# Patient Record
Sex: Male | Born: 1960
Health system: Southern US, Community
[De-identification: ages and names within clinical notes are randomized; demographics above are authoritative.]

## PROBLEM LIST (undated history)

## (undated) DIAGNOSIS — D126 Benign neoplasm of colon, unspecified: Secondary | ICD-10-CM

## (undated) DIAGNOSIS — C20 Malignant neoplasm of rectum: Secondary | ICD-10-CM

## (undated) DIAGNOSIS — K5792 Diverticulitis of intestine, part unspecified, without perforation or abscess without bleeding: Secondary | ICD-10-CM

## (undated) DIAGNOSIS — K7689 Other specified diseases of liver: Secondary | ICD-10-CM

## (undated) HISTORY — DX: Benign neoplasm of colon, unspecified: D12.6

## (undated) HISTORY — DX: Diverticulitis of intestine, part unspecified, without perforation or abscess without bleeding: K57.92

## (undated) HISTORY — DX: Other specified diseases of liver: K76.89

## (undated) HISTORY — PX: LAPAROSCOPIC LOW ANTERIOR RESECTION: SHX5904

---

## 2016-11-27 DIAGNOSIS — J069 Acute upper respiratory infection, unspecified: Secondary | ICD-10-CM | POA: Diagnosis not present

## 2017-02-21 DIAGNOSIS — J42 Unspecified chronic bronchitis: Secondary | ICD-10-CM | POA: Diagnosis not present

## 2017-08-04 ENCOUNTER — Encounter: Payer: Self-pay | Admitting: Internal Medicine

## 2017-08-26 ENCOUNTER — Ambulatory Visit (AMBULATORY_SURGERY_CENTER): Payer: Self-pay

## 2017-08-26 VITALS — Ht 74.0 in | Wt 225.2 lb

## 2017-08-26 DIAGNOSIS — Z1211 Encounter for screening for malignant neoplasm of colon: Secondary | ICD-10-CM

## 2017-08-26 MED ORDER — NA SULFATE-K SULFATE-MG SULF 17.5-3.13-1.6 GM/177ML PO SOLN: 1.0000 | Freq: Once | ORAL | 0 refills | Status: AC

## 2017-08-26 NOTE — Progress Notes (Signed)
Denies allergies to eggs or soy products. Denies complication of anesthesia or sedation. Denies use of weight loss medication. Denies use of O2.   Emmi instructions declined.  

## 2017-10-03 ENCOUNTER — Ambulatory Visit (AMBULATORY_SURGERY_CENTER): Payer: Federal, State, Local not specified - PPO | Admitting: Internal Medicine

## 2017-10-03 ENCOUNTER — Encounter: Payer: Self-pay | Admitting: Internal Medicine

## 2017-10-03 ENCOUNTER — Telehealth: Payer: Self-pay

## 2017-10-03 ENCOUNTER — Other Ambulatory Visit (INDEPENDENT_AMBULATORY_CARE_PROVIDER_SITE_OTHER): Payer: Federal, State, Local not specified - PPO

## 2017-10-03 ENCOUNTER — Other Ambulatory Visit: Payer: Self-pay

## 2017-10-03 VITALS — BP 113/72 | HR 60 | Temp 98.0°F | Resp 12 | Ht 74.0 in | Wt 225.0 lb

## 2017-10-03 DIAGNOSIS — C20 Malignant neoplasm of rectum: Secondary | ICD-10-CM

## 2017-10-03 DIAGNOSIS — C19 Malignant neoplasm of rectosigmoid junction: Secondary | ICD-10-CM | POA: Diagnosis not present

## 2017-10-03 DIAGNOSIS — K629 Disease of anus and rectum, unspecified: Secondary | ICD-10-CM | POA: Diagnosis not present

## 2017-10-03 DIAGNOSIS — Z1211 Encounter for screening for malignant neoplasm of colon: Secondary | ICD-10-CM

## 2017-10-03 DIAGNOSIS — D490 Neoplasm of unspecified behavior of digestive system: Secondary | ICD-10-CM

## 2017-10-03 DIAGNOSIS — D125 Benign neoplasm of sigmoid colon: Secondary | ICD-10-CM

## 2017-10-03 DIAGNOSIS — C187 Malignant neoplasm of sigmoid colon: Secondary | ICD-10-CM | POA: Diagnosis not present

## 2017-10-03 DIAGNOSIS — K6289 Other specified diseases of anus and rectum: Secondary | ICD-10-CM

## 2017-10-03 DIAGNOSIS — C260 Malignant neoplasm of intestinal tract, part unspecified: Secondary | ICD-10-CM

## 2017-10-03 HISTORY — PX: COLONOSCOPY: SHX174

## 2017-10-03 LAB — CBC WITH DIFFERENTIAL/PLATELET
Basophils Absolute: 0 10*3/uL (ref 0.0–0.1)
Basophils Relative: 0.6 % (ref 0.0–3.0)
EOS ABS: 0.1 10*3/uL (ref 0.0–0.7)
EOS PCT: 1.5 % (ref 0.0–5.0)
HCT: 44.6 % (ref 39.0–52.0)
HEMOGLOBIN: 14.7 g/dL (ref 13.0–17.0)
Lymphocytes Relative: 27.3 % (ref 12.0–46.0)
Lymphs Abs: 1.6 10*3/uL (ref 0.7–4.0)
MCHC: 33 g/dL (ref 30.0–36.0)
MCV: 86.6 fl (ref 78.0–100.0)
Monocytes Absolute: 0.5 10*3/uL (ref 0.1–1.0)
Monocytes Relative: 8.5 % (ref 3.0–12.0)
Neutro Abs: 3.6 10*3/uL (ref 1.4–7.7)
Neutrophils Relative %: 62.1 % (ref 43.0–77.0)
Platelets: 283 10*3/uL (ref 150.0–400.0)
RBC: 5.15 Mil/uL (ref 4.22–5.81)
RDW: 12.8 % (ref 11.5–15.5)
WBC: 5.7 10*3/uL (ref 4.0–10.5)

## 2017-10-03 LAB — COMPREHENSIVE METABOLIC PANEL
ALBUMIN: 4.3 g/dL (ref 3.5–5.2)
ALK PHOS: 58 U/L (ref 39–117)
ALT: 17 U/L (ref 0–53)
AST: 17 U/L (ref 0–37)
BUN: 10 mg/dL (ref 6–23)
CO2: 31 mEq/L (ref 19–32)
Calcium: 8.9 mg/dL (ref 8.4–10.5)
Chloride: 102 mEq/L (ref 96–112)
Creatinine, Ser: 1.11 mg/dL (ref 0.40–1.50)
GFR: 72.62 mL/min (ref >60.00)
Glucose, Bld: 94 mg/dL (ref 70–99)
POTASSIUM: 4.6 meq/L (ref 3.5–5.1)
SODIUM: 140 meq/L (ref 135–145)
TOTAL PROTEIN: 6.8 g/dL (ref 6.0–8.3)
Total Bilirubin: 0.7 mg/dL (ref 0.2–1.2)

## 2017-10-03 MED ORDER — SODIUM CHLORIDE 0.9 % IV SOLN: 500.0000 mL | INTRAVENOUS | Status: DC

## 2017-10-03 NOTE — Op Note (Signed)
[facility] Patient Name: Jeffrey Nolan Procedure Date: 10/03/2017 8:03 AM MRN: [id number] Endoscopist: Jerene Bears , MD Age: 56 Referring MD:  Date of Birth: 04-25-1961 Gender: Male Account #: [id number] Procedure:                Colonoscopy Indications:              Screening for colorectal malignant neoplasm, This                            is the patient's first colonoscopy Medicines:                Monitored Anesthesia Care Procedure:                Pre-Anesthesia Assessment:                           - Prior to the procedure, a History and Physical                            was performed, and patient medications and                            allergies were reviewed. The patient's tolerance of                            previous anesthesia was also reviewed. The risks                            and benefits of the procedure and the sedation                            options and risks were discussed with the patient.                            All questions were answered, and informed consent                            was obtained. Prior Anticoagulants: The patient has                            taken no previous anticoagulant or antiplatelet                            agents. ASA Grade Assessment: I - A normal, healthy                            patient. After reviewing the risks and benefits,                            the patient was deemed in satisfactory condition to                            undergo the procedure.  After obtaining informed consent, the colonoscope                            was passed under direct vision. Throughout the                            procedure, the patient's blood pressure, pulse, and                            oxygen saturations were monitored continuously. The                            Colonoscope was introduced through the anus and                            advanced to the the cecum, identified by                             appendiceal orifice and ileocecal valve. The                            colonoscopy was performed without difficulty. The                            patient tolerated the procedure well. The quality                            of the bowel preparation was good. The ileocecal                            valve, appendiceal orifice, and rectum were                            photographed. Scope In: 8:11:23 AM Scope Out: 8:35:41 AM Scope Withdrawal Time: 0 hours 19 minutes 0 seconds  Total Procedure Duration: 0 hours 24 minutes 18 seconds  Findings:                 The digital rectal exam was normal.                           A fungating non-obstructing medium-sized mass with                            central umbilication was found in the mid rectum.                            The mass was non-circumferential. In addition, its                            diameter measured 3-4 cm. This was biopsied with a                            cold forceps for histology. Area proximal to the  mass was tattooed with 2 injections, total of 3 mL                            of Spot (carbon black).                           A 7 mm polyp was found in the proximal sigmoid                            colon. The polyp was pedunculated. The polyp was                            removed with a hot snare. Resection and retrieval                            were complete.                           A 20 mm polyp was found in the distal sigmoid                            colon. The polyp was pedunculated. The polyp was                            removed with a hot snare. Resection and retrieval                            were complete.                           Multiple medium-mouthed diverticula were found in                            the sigmoid colon and ascending colon.                           Internal hemorrhoids were found during                             retroflexion. The hemorrhoids were small. Complications:            No immediate complications. Estimated Blood Loss:     Estimated blood loss was minimal. Impression:               - Likely malignant tumor in the mid rectum.                            Biopsied. Tattooed.                           - One 7 mm polyp in the proximal sigmoid colon,                            removed with a hot snare. Resected and retrieved.                           -  One 20 mm polyp in the distal sigmoid colon,                            removed with a hot snare. Resected and retrieved.                           - Mild diverticulosis in the sigmoid colon and in                            the ascending colon.                           - Small internal hemorrhoids. Recommendation:           - Patient has a contact number available for                            emergencies. The signs and symptoms of potential                            delayed complications were discussed with the                            patient. Return to normal activities tomorrow.                            Written discharge instructions were provided to the                            patient.                           - Resume previous diet.                           - Continue present medications.                           - Await pathology results.                           - Repeat colonoscopy for surveillance based on                            pathology results.                           - Perform a CT scan (computed tomography) of chest                            with contrast, abdomen with contrast and pelvis                            with contrast at the next available appointment.                           -  Anticipate surgical and oncologic referrals, the                            latter based on pathology results. Jerene Bears, MD 10/03/2017 8:45:42 AM This report has been signed electronically.

## 2017-10-03 NOTE — Progress Notes (Signed)
Called to room to assist during endoscopic procedure.  Patient ID and intended procedure confirmed with present staff. Received instructions for my participation in the procedure from the performing physician.  

## 2017-10-03 NOTE — Progress Notes (Signed)
A and O x3. Report to RN. Tolerated MAC anesthesia well.

## 2017-10-03 NOTE — Telephone Encounter (Signed)
Pt scheduled for CT of CAP at [facility] CT 10/08/17@2pm , pt to arrive there at 1:45pm. Pt to have no solid food after 10am, drink bottle 1 of contrast at 12noon and bottle 2 @ 1pm. [facility] RN to notify pt of appt and instructions.

## 2017-10-03 NOTE — Patient Instructions (Signed)
YOU HAD AN ENDOSCOPIC PROCEDURE TODAY AT [facility]:   Refer to the procedure report that was given to you for any specific questions about what was found during the examination.  If the procedure report does not answer your questions, please call your gastroenterologist to clarify.  If you requested that your care partner not be given the details of your procedure findings, then the procedure report has been included in a sealed envelope for you to review at your convenience later.  YOU SHOULD EXPECT: Some feelings of bloating in the abdomen. Passage of more gas than usual.  Walking can help get rid of the air that was put into your GI tract during the procedure and reduce the bloating. If you had a lower endoscopy (such as a colonoscopy or flexible sigmoidoscopy) you may notice spotting of blood in your stool or on the toilet paper. If you underwent a bowel prep for your procedure, you may not have a normal bowel movement for a few days.  Please Note:  You might notice some irritation and congestion in your nose or some drainage.  This is from the oxygen used during your procedure.  There is no need for concern and it should clear up in a day or so.  SYMPTOMS TO REPORT IMMEDIATELY:   Following lower endoscopy (colonoscopy or flexible sigmoidoscopy):  Excessive amounts of blood in the stool  Significant tenderness or worsening of abdominal pains  Swelling of the abdomen that is new, acute  Fever of 100F or higher   For urgent or emergent issues, a gastroenterologist can be reached at any hour by calling [phone].   DIET:  We do recommend a small meal at first, but then you may proceed to your regular diet.  Drink plenty of fluids but you should avoid alcoholic beverages for 24 hours.  ACTIVITY:  You should plan to take it easy for the rest of today and you should NOT DRIVE or use heavy machinery until tomorrow (because of the sedation medicines used during the test).     FOLLOW UP: Our staff will call the number listed on your records the next business day following your procedure to check on you and address any questions or concerns that you may have regarding the information given to you following your procedure. If we do not reach you, we will leave a message.  However, if you are feeling well and you are not experiencing any problems, there is no need to return our call.  We will assume that you have returned to your regular daily activities without incident.  If any biopsies were taken you will be contacted by phone or by letter within the next 1-3 weeks.  Please call us at [phone] if you have not heard about the biopsies in 3 weeks.    SIGNATURES/CONFIDENTIALITY: You and/or your care partner have signed paperwork which will be entered into your electronic medical record.  These signatures attest to the fact that that the information above on your After Visit Summary has been reviewed and is understood.  Full responsibility of the confidentiality of this discharge information lies with you and/or your care-partner.  Polyp, diverticulosis, and hemorrhoid information given.  Instructions for CT scan given with contrast.   Labs (CMP and CBC) prior to discharge.

## 2017-10-03 NOTE — Progress Notes (Signed)
Pt. Reports no change in his medical or surgical history since his pre-visit 08/26/2017.

## 2017-10-06 ENCOUNTER — Telehealth: Payer: Self-pay | Admitting: *Deleted

## 2017-10-06 ENCOUNTER — Telehealth: Payer: Self-pay | Admitting: Internal Medicine

## 2017-10-06 NOTE — Telephone Encounter (Signed)
Received call from Dr. Saralyn Pilar from pathology:  2 polyps showed invasive adenocarcinoma  Second part sigmoid showed invasive adenocarcinoma involving tubular adenoma  Part 3 invasive adenocarcinoma.  If questions call Dr. Saralyn Pilar [phone].

## 2017-10-06 NOTE — Telephone Encounter (Signed)
Pts CT scan moved up to 10/08/17@11 :15am, needs to arrive at [place] [place] at 11am. NPO after 7:15am, drink bottle one of contrast at 9:15am, bottle 2 at 10:15am. Flex-sig scheduled in the [facility] 10/08/17@2 :30pm, pt to check in at 1:30pm. Pt to drink bottle of mag citrate the night of  10/07/17 and use 2 fleets enemas prior to the Flex-sig. Pt may need to bring them with him to the [facility] to do them. [facility] referral and cancer center referral made. Patient aware.

## 2017-10-06 NOTE — Telephone Encounter (Signed)
I spoke with Dr. Saralyn Pilar by phone regarding Mr. [facility] pathology results He has a rectal adenocarcinoma 1 of the 2 sigmoid polyps, specifically the more distal sigmoid polyp was an adenocarcinoma.  This was a pedunculated lesion and the stalk margin was free of dysplasia and malignancy with a 2 mm margin. The more proximal sigmoid polyp was an adenoma without high-grade dysplasia  Patient has CT scan chest abdomen pelvis on Wednesday. I recommend that we have him return for a flex sig so that I can place a tattoo just proximal to the distal sigmoid polyp removed last Friday now known to be an adenocarcinoma.  I expect this lesion was cured by polypectomy but prefer tattooing proximal to this for surveillance purposes and possibly to include this segment in his future resection for his known rectal cancer.  Please refer to general surgery and medical oncology as soon as possible Patient very anxious to have surgical and oncology appointments. Time provided for questions and answers, he thanked me for the call  Recall colonoscopy 1 year Flex sig on Wednesday.

## 2017-10-06 NOTE — Telephone Encounter (Signed)
Pt calling for biopsy results, states Dr. Hilarie Fredrickson was going to call him today with results. Please advise. Looks like results not back yet in epic.

## 2017-10-06 NOTE — Telephone Encounter (Signed)
  Follow up Call-  Call back number 10/03/2017  Post procedure Call Back phone  # [phone]  Permission to leave phone message Yes     Patient questions:  Do you have a fever, pain , or abdominal swelling? No. Pain Score  0 *  Have you tolerated food without any problems? Yes.    Have you been able to return to your normal activities? Yes.    Do you have any questions about your discharge instructions: Diet   No. Medications  No. Follow up visit  No.  Do you have questions or concerns about your Care? No.  Actions: * If pain score is 4 or above: No action needed, pain <4.

## 2017-10-07 ENCOUNTER — Other Ambulatory Visit: Payer: Self-pay

## 2017-10-07 DIAGNOSIS — C189 Malignant neoplasm of colon, unspecified: Secondary | ICD-10-CM

## 2017-10-08 ENCOUNTER — Ambulatory Visit (AMBULATORY_SURGERY_CENTER): Payer: Federal, State, Local not specified - PPO | Admitting: Internal Medicine

## 2017-10-08 ENCOUNTER — Other Ambulatory Visit: Payer: Self-pay

## 2017-10-08 ENCOUNTER — Other Ambulatory Visit: Payer: Federal, State, Local not specified - PPO

## 2017-10-08 ENCOUNTER — Inpatient Hospital Stay: Admission: RE | Admit: 2017-10-08 | Payer: Federal, State, Local not specified - PPO | Source: Ambulatory Visit

## 2017-10-08 ENCOUNTER — Ambulatory Visit (INDEPENDENT_AMBULATORY_CARE_PROVIDER_SITE_OTHER)
Admission: RE | Admit: 2017-10-08 | Discharge: 2017-10-08 | Disposition: A | Discharge status: Home / Self Care | Payer: Federal, State, Local not specified - PPO | Source: Ambulatory Visit | Attending: Internal Medicine | Admitting: Internal Medicine

## 2017-10-08 ENCOUNTER — Encounter: Payer: Self-pay | Admitting: Internal Medicine

## 2017-10-08 VITALS — BP 120/76 | HR 63 | Temp 98.0°F | Resp 16 | Ht 74.0 in | Wt 225.0 lb

## 2017-10-08 DIAGNOSIS — D490 Neoplasm of unspecified behavior of digestive system: Secondary | ICD-10-CM

## 2017-10-08 DIAGNOSIS — C20 Malignant neoplasm of rectum: Secondary | ICD-10-CM

## 2017-10-08 DIAGNOSIS — C187 Malignant neoplasm of sigmoid colon: Secondary | ICD-10-CM | POA: Diagnosis present

## 2017-10-08 DIAGNOSIS — K769 Liver disease, unspecified: Secondary | ICD-10-CM

## 2017-10-08 DIAGNOSIS — C218 Malignant neoplasm of overlapping sites of rectum, anus and anal canal: Secondary | ICD-10-CM | POA: Diagnosis not present

## 2017-10-08 MED ORDER — SODIUM CHLORIDE 0.9 % IV SOLN: 500.0000 mL | Freq: Once | INTRAVENOUS | Status: DC

## 2017-10-08 MED ORDER — IOPAMIDOL (ISOVUE-300) INJECTION 61%
100.0000 mL | Freq: Once | INTRAVENOUS | Status: AC | PRN
  Administered 2017-10-08: 100 mL via INTRAVENOUS

## 2017-10-08 NOTE — Progress Notes (Signed)
  [facility] Post-op Note  Patient: Keshawn Fiorito  Procedure(s) Performed: flexible sigmoidoscopy  Patient Location: [facility] - Recovery Area  Anesthesia Type: Deep Sedation/Propofol  Level of Consciousness: awake, oriented and patient cooperative  Airway and Oxygen Therapy: Patient Spontanous Breathing  Post-op Pain: none  Post-op Assessment:  Post-op Vital signs reviewed, Patient's Cardiovascular Status Stable, Respiratory Function Stable, Patent Airway, No signs of Nausea or vomiting and Pain level controlled  Post-op Vital Signs: Reviewed and stable  Complications: No apparent anesthesia complications  Cannan Beeck E Kataryna Mcquilkin 3:00 PM

## 2017-10-08 NOTE — Progress Notes (Signed)
Called to room to assist during endoscopic procedure.  Patient ID and intended procedure confirmed with present staff. Received instructions for my participation in the procedure from the performing physician.  

## 2017-10-08 NOTE — Patient Instructions (Signed)
YOU HAD AN ENDOSCOPIC PROCEDURE TODAY AT [facility]:   Refer to the procedure report that was given to you for any specific questions about what was found during the examination.  If the procedure report does not answer your questions, please call your gastroenterologist to clarify.  If you requested that your care partner not be given the details of your procedure findings, then the procedure report has been included in a sealed envelope for you to review at your convenience later.  YOU SHOULD EXPECT: Some feelings of bloating in the abdomen. Passage of more gas than usual.  Walking can help get rid of the air that was put into your GI tract during the procedure and reduce the bloating. If you had a lower endoscopy (such as a colonoscopy or flexible sigmoidoscopy) you may notice spotting of blood in your stool or on the toilet paper. If you underwent a bowel prep for your procedure, you may not have a normal bowel movement for a few days.  Please Note:  You might notice some irritation and congestion in your nose or some drainage.  This is from the oxygen used during your procedure.  There is no need for concern and it should clear up in a day or so.  SYMPTOMS TO REPORT IMMEDIATELY:   Following lower endoscopy (colonoscopy or flexible sigmoidoscopy):  Excessive amounts of blood in the stool  Significant tenderness or worsening of abdominal pains  Swelling of the abdomen that is new, acute  Fever of 100F or higher  For urgent or emergent issues, a gastroenterologist can be reached at any hour by calling [phone].   DIET:  We do recommend a small meal at first, but then you may proceed to your regular diet.  Drink plenty of fluids but you should avoid alcoholic beverages for 24 hours.  ACTIVITY:  You should plan to take it easy for the rest of today and you should NOT DRIVE or use heavy machinery until tomorrow (because of the sedation medicines used during the test).     FOLLOW UP: Our staff will call the number listed on your records the next business day following your procedure to check on you and address any questions or concerns that you may have regarding the information given to you following your procedure. If we do not reach you, we will leave a message.  However, if you are feeling well and you are not experiencing any problems, there is no need to return our call.  We will assume that you have returned to your regular daily activities without incident.  See Dr. Dema Severin  Friday at 3:45PM  If any biopsies were taken you will be contacted by phone or by letter within the next 1-3 weeks.  Please call us at [phone] if you have not heard about the biopsies in 3 weeks.    SIGNATURES/CONFIDENTIALITY: You and/or your care partner have signed paperwork which will be entered into your electronic medical record.  These signatures attest to the fact that that the information above on your After Visit Summary has been reviewed and is understood.  Full responsibility of the confidentiality of this discharge information lies with you and/or your care-partner.

## 2017-10-08 NOTE — Op Note (Addendum)
[facility] Patient Name: Jeffrey Nolan Procedure Date: 10/08/2017 2:30 PM MRN: [id number] Endoscopist: Jerene Bears , MD Age: 56 Referring MD:  Date of Birth: January 17, 1961 Gender: Male Account #: [id number] Procedure:                Flexible Sigmoidoscopy Indications:              Rectal cancer recently diagnosed, Cancer in the                            sigmoid colon recently diagnosed (plan to tattoo                            proximal to the sigmoid cancer resected last week) Medicines:                Monitored Anesthesia Care Procedure:                Pre-Anesthesia Assessment:                           - Prior to the procedure, a History and Physical                            was performed, and patient medications and                            allergies were reviewed. The patient's tolerance of                            previous anesthesia was also reviewed. The risks                            and benefits of the procedure and the sedation                            options and risks were discussed with the patient.                            All questions were answered, and informed consent                            was obtained. Prior Anticoagulants: The patient has                            taken no previous anticoagulant or antiplatelet                            agents. ASA Grade Assessment: II - A patient with                            mild systemic disease. After reviewing the risks                            and benefits, the patient was deemed in  satisfactory condition to undergo the procedure.                           After obtaining informed consent, the scope was                            passed under direct vision. The Model PCF-H190DL                            [id number]) scope was introduced through the anus                            and advanced to the the descending colon. The                            flexible  sigmoidoscopy was accomplished without                            difficulty. The patient tolerated the procedure                            well. The quality of the bowel preparation was good. Scope In: Scope Out: Findings:                 The digital rectal exam revealed a rectal mass.                            This is palpable with only the tip of the examining                            finger.                           A single (solitary) six mm post-polypectomy ulcer                            was found in the mid sigmoid colon at approximately                            30 cm from the dentate line.                           A single (solitary) ten mm post-polypectomy ulcer                            was found in the distal sigmoid colon at                            approximately 22 cm from the dentate line. Area                            proximal to this ulceration was tattooed with 2  injections of total 3 mL of Spot (carbon black). No                            evidence for residual polyp.                           A non-obstructing mass was found in the mid rectum.                            The mass was non-circumferential. Complications:            No immediate complications. Estimated Blood Loss:     Estimated blood loss: none. Impression:               - Rectal mass.                           - A single (solitary) post-polypectomy ulcer in the                            mid sigmoid colon.                           - A single (solitary) post-polypectomy ulcer in the                            distal sigmoid colon. Tattooed.                           - Malignant tumor in the mid rectum.                           - No specimens collected. Recommendation:           - Patient has a contact number available for                            emergencies. The signs and symptoms of potential                            delayed complications were discussed  with the                            patient. Return to normal activities tomorrow.                            Written discharge instructions were provided to the                            patient.                           - Resume previous diet.                           - Follow-up results of todays CT scans.                           -  Surgical appointment to be scheduled and oncology                            thereafter. Jerene Bears, MD 10/08/2017 3:11:55 PM This report has been signed electronically. CC Letter to:             Sharon Mt. White MD, MD

## 2017-10-09 ENCOUNTER — Other Ambulatory Visit: Payer: Self-pay

## 2017-10-09 ENCOUNTER — Telehealth: Payer: Self-pay

## 2017-10-09 NOTE — Telephone Encounter (Signed)
  Follow up Call-  Call back number 10/08/2017 10/03/2017  Post procedure Call Back phone  # [phone]  Permission to leave phone message Yes Yes     Left message

## 2017-10-09 NOTE — Telephone Encounter (Signed)
  Follow up Call-  Call back number 10/08/2017 10/03/2017  Post procedure Call Back phone  # [phone]  Permission to leave phone message Yes Yes     Patient questions:  Do you have a fever, pain , or abdominal swelling? No. Pain Score  0 *  Have you tolerated food without any problems? Yes.    Have you been able to return to your normal activities? Yes.    Do you have any questions about your discharge instructions: Diet   No. Medications  No. Follow up visit  No.  Do you have questions or concerns about your Care? No.  Actions: * If pain score is 4 or above: No action needed, pain <4.

## 2017-10-10 ENCOUNTER — Telehealth: Payer: Self-pay

## 2017-10-10 ENCOUNTER — Telehealth: Payer: Self-pay | Admitting: Internal Medicine

## 2017-10-10 NOTE — Telephone Encounter (Signed)
Patient called to confirm attendance at office visit on 10/22/17. Returned call to let patient know that we will see him as scheduled. Patient voiced understanding.

## 2017-10-10 NOTE — Telephone Encounter (Signed)
Spoke with pt and let him know he needed to keep the appt as this was the cancer center referral appts.

## 2017-10-10 NOTE — Telephone Encounter (Signed)
Patient states Jeffrey Nolan called him yesterday and made him and appt at [facility] with Dr.Ashburn on 1.16.19 but pt states he just got a call for an appt with a surgeon, Dr. Benay Spice for 1.2.19 and wants to know which one to go to.

## 2017-10-13 NOTE — Progress Notes (Signed)
GI Location of Tumor / Histology: Rectal cancer  Jeffrey Nolan presented  months ago with symptoms :none,  Biopsies of  (if applicable) revealed: Diagnosis 10/03/17: 1. Colon, polyp(s), sigmoid, polyp (2) - TUBULAR ADENOMA (ONE POLYP). - STALK MARGIN NOT INVOLVED BY ADENOMA. - NO HIGH GRADE DYSPLASIA OR MALIGNANCY. 2. Colon, polyp(s), sigmoid, polyp - INVASIVE COLORECTAL ADENOCARCINOMA INVOLVING A TUBULAR ADENOMA. - STALK MARGIN NOT INVOLVED BY INVASIVE CARCINOMA OR ADENOMA. - SEE MICROSCOPIC DESCRIPTION. 3. Rectum, polyp(s), tumor, polyp - INVASIVE COLORECTAL ADENOCARCINOMA  Dr. Hilarie Fredrickson, MD 10/03/17 colonoscopy with BX, sees Dr. Drue Flirt sees Dec/16/18   Past/Anticipated interventions by medical oncology, if any:Dr. Benay Spice  appt 10/22/17@ 9am. MR/ abdomen scheduled 10/16/17  Weight changes, if any:  none  Bowel/Bladder complaints, if CNO:BSJG,   Nausea / Vomiting, if any: No Pain issues, if any: No  Any blood per rectum:   None SAFETY ISSUES: NO  Prior radiation? NO  Pacemaker/ICD? No  Is the patient on methotrexate? NO  Current Complaints/Details: Married,  Father deceased lung cancer non smoker, originally had bladder cancer,  Allergies:NKA .BP 117/89   Pulse 80   Temp 97.7 F (36.5 C) (Oral)   Resp 20   Ht 6\' 2"  (1.88 m)   Wt 227 lb 9.6 oz (103.2 kg)   BMI 29.22 kg/m   Wt Readings from Last 3 Encounters:  10/15/17 227 lb 9.6 oz (103.2 kg)  10/08/17 225 lb (102.1 kg)  10/03/17 225 lb (102.1 kg)

## 2017-10-15 ENCOUNTER — Ambulatory Visit
Admission: RE | Admit: 2017-10-15 | Discharge: 2017-10-15 | Disposition: A | Discharge status: Home / Self Care | Payer: Federal, State, Local not specified - PPO | Source: Ambulatory Visit | Attending: Radiation Oncology | Admitting: Radiation Oncology

## 2017-10-15 ENCOUNTER — Encounter: Payer: Self-pay | Admitting: Radiation Oncology

## 2017-10-15 ENCOUNTER — Other Ambulatory Visit: Payer: Self-pay | Admitting: Radiation Oncology

## 2017-10-15 DIAGNOSIS — Z791 Long term (current) use of non-steroidal anti-inflammatories (NSAID): Secondary | ICD-10-CM | POA: Insufficient documentation

## 2017-10-15 DIAGNOSIS — Z8551 Personal history of malignant neoplasm of bladder: Secondary | ICD-10-CM | POA: Diagnosis not present

## 2017-10-15 DIAGNOSIS — Z801 Family history of malignant neoplasm of trachea, bronchus and lung: Secondary | ICD-10-CM | POA: Insufficient documentation

## 2017-10-15 DIAGNOSIS — C20 Malignant neoplasm of rectum: Secondary | ICD-10-CM

## 2017-10-15 DIAGNOSIS — Z79899 Other long term (current) drug therapy: Secondary | ICD-10-CM | POA: Insufficient documentation

## 2017-10-15 DIAGNOSIS — Z510 Encounter for antineoplastic radiation therapy: Secondary | ICD-10-CM | POA: Insufficient documentation

## 2017-10-15 DIAGNOSIS — Z8052 Family history of malignant neoplasm of bladder: Secondary | ICD-10-CM | POA: Insufficient documentation

## 2017-10-15 DIAGNOSIS — Z87891 Personal history of nicotine dependence: Secondary | ICD-10-CM | POA: Insufficient documentation

## 2017-10-15 HISTORY — DX: Malignant neoplasm of rectum: C20

## 2017-10-15 NOTE — Progress Notes (Signed)
Radiation Oncology         ([phone]) [phone] ________________________________  Name: Jeffrey Nolan        MRN: [id number]  Date of Service: 10/15/2017 DOB: 09/23/1961  YQ:MVHQION, No Pcp Per  Ladell Pier, MD     REFERRING PHYSICIAN: Ladell Pier, MD   DIAGNOSIS: The encounter diagnosis was Rectal adenocarcinoma [facility]).   HISTORY OF PRESENT ILLNESS: Jeffrey Nolan is a 56 y.o. male seen at the request of Dr. Benay Spice. Patient went for a screening colonoscopy, with no changes or symptoms present. This was performed on 10/03/17 revealing a 3-4 cm mass in the rectum, as well as two polyps within the sigmoid colon. Final pathology of the rectal tumor was invasive adenocarcinoma, and the one polyp revealed tubular adenomatous change and invasive adenocarcinoma within a tubular adenoma. On 10/08/17 patient underwent tattooing with sigmoidoscopy, as well as a  CT of the chest, abdomen, and pelvis revealing 13 mm left renal lesion likely a complex cyst although there is a suggestion of internal architecture on the delayed images. No evidence for metastatic disease in the thorax. No lymphadenopathy in the abdomen or pelvis. No perirectal lymph nodes were evident in scan. Several tiny hypoattenuating lesions in the liver were too small to characterize and need further evaluation. He is to have an MRI of the abdomen at 12 pm  on 10/16/2017. Will see Dr. Benay Spice 10/22/17 at 9 am and he will also see Dr. Drue Flirt 11/05/16. He comes today to discuss options of radiotherapy in the neoadjuvant setting.   PREVIOUS RADIATION THERAPY: No   PAST MEDICAL HISTORY:  Past Medical History:  Diagnosis Date  . Cancer ([place])    Sigmoid (arising in polyp) and Rectum       PAST SURGICAL HISTORY:No past surgical history on file.   FAMILY HISTORY:  Family History  Problem Relation Age of Onset  . Bladder Cancer Father   . Lung cancer Father   . Colon cancer Neg Hx   . Esophageal cancer Neg Hx   . Pancreatic  cancer Neg Hx   . Rectal cancer Neg Hx   . Stomach cancer Neg Hx    Father died from lung cancer, nonsmoker. He originally had bladder cancer.    SOCIAL HISTORY:  reports that he has quit smoking. He has quit using smokeless tobacco. He reports that he drinks alcohol. He reports that he does not use drugs. He is married and accompanied by his wife. 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    ALLERGIES: Patient has no known allergies.   MEDICATIONS:  Current Outpatient Medications  Medication Sig Dispense Refill  . Ibuprofen-Diphenhydramine Cit (ADVIL PM PO) Take 1 capsule by mouth daily after supper.    . Melatonin 10 MG SUBL Place 10 mg under the tongue daily after supper.    Marland Kitchen OVER THE COUNTER MEDICATION Niacin 500 mg two tablets daily.    Marland Kitchen OVER THE COUNTER MEDICATION Garlic extract 6295 mg. One tablet daily.    Marland Kitchen OVER THE COUNTER MEDICATION Vitamin D 3 1000 units one tablet daily.    Marland Kitchen OVER THE COUNTER MEDICATION Fish Oil 1000 mg daily.    Marland Kitchen OVER THE COUNTER MEDICATION Vitamin C 1000 mg daily.    Marland Kitchen OVER THE COUNTER MEDICATION Biotin 1000 mg daily.    Marland Kitchen OVER THE COUNTER MEDICATION B complex vitamin. One tablet 4 times a week.    Marland Kitchen  sildenafil (REVATIO) 20 MG tablet TAKE 3 TABLETS BY MOUTH AS DIRECTED  3   Current Facility-Administered Medications  Medication Dose Route Frequency Provider Last Rate Last Dose  . 0.9 %  sodium chloride infusion  500 mL Intravenous Continuous Pyrtle, Lajuan Lines, MD      . 0.9 %  sodium chloride infusion  500 mL Intravenous Once Pyrtle, Lajuan Lines, MD      . 0.9 %  sodium chloride infusion  500 mL Intravenous Once Pyrtle, Lajuan Lines, MD         REVIEW OF SYSTEMS: On review of systems, the patient reports that he is doing well overall. he denies any chest pain, shortness of breath, cough, fevers, chills, night sweats, unintended weight changes. He denies any bowel or bladder  disturbances, and denies abdominal pain, nausea or vomiting, hematochezia, or melana. He denies any new musculoskeletal or joint aches or pains. A complete review of systems is obtained and is otherwise negative.     PHYSICAL EXAM:  Wt Readings from Last 3 Encounters:  10/15/17 227 lb 9.6 oz (103.2 kg)  10/08/17 225 lb (102.1 kg)  10/03/17 225 lb (102.1 kg)   Temp Readings from Last 3 Encounters:  10/15/17 97.7 F (36.5 C) (Oral)  10/08/17 98 F (36.7 C)  10/03/17 98 F (36.7 C)   BP Readings from Last 3 Encounters:  10/15/17 117/89  10/08/17 120/76  10/03/17 113/72   Pulse Readings from Last 3 Encounters:  10/15/17 80  10/08/17 63  10/03/17 60   Pain Assessment Pain Score: 0-No pain/10  In general this is a well appearing he is in no acute distress. he is alert and oriented x4 and appropriate throughout the examination. HEENT reveals that the patient is normocephalic, atraumatic. EOMs are intact. PERRLA. Skin is intact without any evidence of gross lesions. Cardiovascular exam reveals a regular rate and rhythm, no clicks rubs or murmurs are auscultated. Chest is clear to auscultation bilaterally. Lymphatic assessment is performed and does not reveal any adenopathy in the cervical, supraclavicular, axillary, or inguinal chains. Abdomen has active bowel sounds in all quadrants and is intact. The abdomen is soft, non tender, non distended. Lower extremities are negative for pretibial pitting edema, deep calf tenderness, cyanosis or clubbing.   ECOG = 0  0 - Asymptomatic (Fully active, able to carry on all predisease activities without restriction)  1 - Symptomatic but completely ambulatory (Restricted in physically strenuous activity but ambulatory and able to carry out work of a light or sedentary nature. For example, light housework, office work)  2 - Symptomatic, <50% in bed during the day (Ambulatory and capable of all self care but unable to carry out any work activities.  Up and about more than 50% of waking hours)  3 - Symptomatic, >50% in bed, but not bedbound (Capable of only limited self-care, confined to bed or chair 50% or more of waking hours)  4 - Bedbound (Completely disabled. Cannot carry on any self-care. Totally confined to bed or chair)  5 - Death   Eustace Pen MM, Creech RH, Tormey DC, et al. [phone]). "Toxicity and response criteria of the [facility]". [place] Oncol. 5 (6): 649-55    LABORATORY DATA:  Lab Results  Component Value Date   WBC 5.7 10/03/2017   HGB 14.7 10/03/2017   HCT 44.6 10/03/2017   MCV 86.6 10/03/2017   PLT 283.0 10/03/2017   Lab Results  Component Value Date   NA 140 10/03/2017  K 4.6 10/03/2017   CL 102 10/03/2017   CO2 31 10/03/2017   Lab Results  Component Value Date   ALT 17 10/03/2017   AST 17 10/03/2017   ALKPHOS 58 10/03/2017   BILITOT 0.7 10/03/2017      RADIOGRAPHY: Ct Chest W Contrast  Result Date: 10/08/2017 CLINICAL DATA:  Rectal cancer EXAM: CT CHEST, ABDOMEN, AND PELVIS WITH CONTRAST TECHNIQUE: Multidetector CT imaging of the chest, abdomen and pelvis was performed following the standard protocol during bolus administration of intravenous contrast. CONTRAST:  136mL ISOVUE-300 IOPAMIDOL (ISOVUE-300) INJECTION 61% COMPARISON:  None. FINDINGS: CT CHEST FINDINGS Cardiovascular: The heart size is normal. No pericardial effusion. No thoracic aortic aneurysm. Mediastinum/Nodes: No mediastinal lymphadenopathy. Calcified lymph nodes are seen in the subcarinal station and right hilum. The esophagus has normal imaging features. There is no hilar lymphadenopathy. Lungs/Pleura: Calcified granuloma identified in the medial right lung base. No suspicious pulmonary nodule or mass. No focal airspace consolidation. No pleural effusion. Musculoskeletal: Bone windows reveal no worrisome lytic or sclerotic osseous lesions. CT ABDOMEN PELVIS FINDINGS Hepatobiliary: Several tiny hypoattenuating  lesions are identified in the liver parenchyma (see images 56, 62, 63, and 71). These are too small to characterize and measure up to a maximum of about 8 mm diameter. There is no evidence for gallstones, gallbladder wall thickening, or pericholecystic fluid. No intrahepatic or extrahepatic biliary dilation. Pancreas: No focal mass lesion. No dilatation of the main duct. No intraparenchymal cyst. No peripancreatic edema. Spleen: No splenomegaly. No focal mass lesion. Adrenals/Urinary Tract: No adrenal nodule or mass. Right kidney is normal. 13 mm left renal lesion identified in the upper pole has attenuation higher than would be expected for a simple cyst (image 75 series 2) in shows stippled internal high attenuation on delayed imaging (see image 10 series 5). No evidence for hydroureter. The urinary bladder appears normal for the degree of distention. Stomach/Bowel: Stomach is nondistended. No gastric wall thickening. No evidence of outlet obstruction. Duodenum is normally positioned as is the ligament of Treitz. No small bowel wall thickening. No small bowel dilatation. The terminal ileum is normal. The appendix is normal. Mild diverticular change noted left colon. No discrete distal colonic lesion is identified in this patient with known rectal cancer. Vascular/Lymphatic: No abdominal aortic aneurysm. There is no gastrohepatic or hepatoduodenal ligament lymphadenopathy. No intraperitoneal or retroperitoneal lymphadenopathy. No pelvic sidewall lymphadenopathy. No evidence for perirectal lymph nodes. Reproductive: The prostate gland and seminal vesicles have normal imaging features. Other: No intraperitoneal free fluid. Musculoskeletal: Bone windows reveal no worrisome lytic or sclerotic osseous lesions. IMPRESSION: 1. Several tiny hypoattenuating lesions in the liver are too small to characterize. While these are likely benign, MRI of the abdomen without and with contrast recommended to further evaluate. 2. 13 mm  left renal lesion likely a complex cyst although there is a suggestion of internal architecture on the delayed images today. This could also be further assessed at the time of abdominal MRI. 3. No evidence for metastatic disease in the thorax. No lymphadenopathy in the abdomen or pelvis. No perirectal lymph nodes evident on today's scan. Electronically Signed   By: Misty Stanley M.D.   On: 10/08/2017 14:15   Ct Abdomen Pelvis W Contrast  Result Date: 10/08/2017 CLINICAL DATA:  Rectal cancer EXAM: CT CHEST, ABDOMEN, AND PELVIS WITH CONTRAST TECHNIQUE: Multidetector CT imaging of the chest, abdomen and pelvis was performed following the standard protocol during bolus administration of intravenous contrast. CONTRAST:  166mL ISOVUE-300 IOPAMIDOL (ISOVUE-300) INJECTION 61%  COMPARISON:  None. FINDINGS: CT CHEST FINDINGS Cardiovascular: The heart size is normal. No pericardial effusion. No thoracic aortic aneurysm. Mediastinum/Nodes: No mediastinal lymphadenopathy. Calcified lymph nodes are seen in the subcarinal station and right hilum. The esophagus has normal imaging features. There is no hilar lymphadenopathy. Lungs/Pleura: Calcified granuloma identified in the medial right lung base. No suspicious pulmonary nodule or mass. No focal airspace consolidation. No pleural effusion. Musculoskeletal: Bone windows reveal no worrisome lytic or sclerotic osseous lesions. CT ABDOMEN PELVIS FINDINGS Hepatobiliary: Several tiny hypoattenuating lesions are identified in the liver parenchyma (see images 56, 62, 63, and 71). These are too small to characterize and measure up to a maximum of about 8 mm diameter. There is no evidence for gallstones, gallbladder wall thickening, or pericholecystic fluid. No intrahepatic or extrahepatic biliary dilation. Pancreas: No focal mass lesion. No dilatation of the main duct. No intraparenchymal cyst. No peripancreatic edema. Spleen: No splenomegaly. No focal mass lesion. Adrenals/Urinary  Tract: No adrenal nodule or mass. Right kidney is normal. 13 mm left renal lesion identified in the upper pole has attenuation higher than would be expected for a simple cyst (image 75 series 2) in shows stippled internal high attenuation on delayed imaging (see image 10 series 5). No evidence for hydroureter. The urinary bladder appears normal for the degree of distention. Stomach/Bowel: Stomach is nondistended. No gastric wall thickening. No evidence of outlet obstruction. Duodenum is normally positioned as is the ligament of Treitz. No small bowel wall thickening. No small bowel dilatation. The terminal ileum is normal. The appendix is normal. Mild diverticular change noted left colon. No discrete distal colonic lesion is identified in this patient with known rectal cancer. Vascular/Lymphatic: No abdominal aortic aneurysm. There is no gastrohepatic or hepatoduodenal ligament lymphadenopathy. No intraperitoneal or retroperitoneal lymphadenopathy. No pelvic sidewall lymphadenopathy. No evidence for perirectal lymph nodes. Reproductive: The prostate gland and seminal vesicles have normal imaging features. Other: No intraperitoneal free fluid. Musculoskeletal: Bone windows reveal no worrisome lytic or sclerotic osseous lesions. IMPRESSION: 1. Several tiny hypoattenuating lesions in the liver are too small to characterize. While these are likely benign, MRI of the abdomen without and with contrast recommended to further evaluate. 2. 13 mm left renal lesion likely a complex cyst although there is a suggestion of internal architecture on the delayed images today. This could also be further assessed at the time of abdominal MRI. 3. No evidence for metastatic disease in the thorax. No lymphadenopathy in the abdomen or pelvis. No perirectal lymph nodes evident on today's scan. Electronically Signed   By: Misty Stanley M.D.   On: 10/08/2017 14:15       IMPRESSION/PLAN: 1. T2-3Nx invasive adenocarcinoma of the rectum.  Dr. Lisbeth Renshaw discusses the pathology findings and reviews the nature of rectal cancer. He discusses the role of an MRI of the pelvis to better stage his cancer. If he has a T2N0 lesion, surgery may be all that is recommended, however if he has T2N1 or greater disease, he would recommend neoadjuvant radiotherapy with chemosensitization to treat his disease prior to surgery. We discussed the risks, benefits, short, and long term effects of radiotherapy, and the patient is interested in proceeding. Dr. Lisbeth Renshaw discusses the delivery and logistics of radiotherapy and anticipates a course of 5 1/2-6 weeks. Written consent is obtained and placed in the chart, a copy was provided to the patient. He will simulate tomorrow provided that his MRI dictates a role to move forward. We would anticipate proceeding with treatment on 10/27/17.  The above documentation reflects my direct findings during this shared patient visit. Please see the separate note by Dr. Lisbeth Renshaw on this date for the remainder of the patient's plan of care.     Carola Rhine, PAC --------------------------------------------  Jodelle Gross, MD, PhD  This document serves as a record of services personally performed by Kyung Rudd MD. It was created on his behalf by Delton Coombes, a trained medical scribe. The creation of this record is based on the scribe's personal observations and the provider's statements to them.

## 2017-10-15 NOTE — Progress Notes (Signed)
Please see the Nurse Progress Note in the MD Initial Consult Encounter for this patient. 

## 2017-10-16 ENCOUNTER — Ambulatory Visit: Payer: Federal, State, Local not specified - PPO | Admitting: Radiation Oncology

## 2017-10-16 ENCOUNTER — Ambulatory Visit (HOSPITAL_COMMUNITY): Admission: RE | Admit: 2017-10-16 | Payer: Federal, State, Local not specified - PPO | Source: Ambulatory Visit

## 2017-10-16 ENCOUNTER — Ambulatory Visit (HOSPITAL_COMMUNITY)
Admission: RE | Admit: 2017-10-16 | Discharge: 2017-10-16 | Disposition: A | Discharge status: Home / Self Care | Payer: Federal, State, Local not specified - PPO | Source: Ambulatory Visit | Attending: Internal Medicine | Admitting: Internal Medicine

## 2017-10-16 ENCOUNTER — Ambulatory Visit (HOSPITAL_COMMUNITY)
Admission: RE | Admit: 2017-10-16 | Discharge: 2017-10-16 | Disposition: A | Discharge status: Home / Self Care | Payer: Federal, State, Local not specified - PPO | Source: Ambulatory Visit | Attending: Radiation Oncology | Admitting: Radiation Oncology

## 2017-10-16 DIAGNOSIS — J069 Acute upper respiratory infection, unspecified: Secondary | ICD-10-CM | POA: Diagnosis not present

## 2017-10-16 DIAGNOSIS — C20 Malignant neoplasm of rectum: Secondary | ICD-10-CM

## 2017-10-16 DIAGNOSIS — K769 Liver disease, unspecified: Secondary | ICD-10-CM

## 2017-10-16 MED ORDER — GADOBENATE DIMEGLUMINE 529 MG/ML IV SOLN: 20.0000 mL | Freq: Once | INTRAVENOUS | Status: DC | PRN

## 2017-10-16 NOTE — Progress Notes (Signed)
Pt unable to do MRI due to claustrophobia. Pt to call office in am and get pre meds. Order is still in epic. Order will need to be rescheduled.

## 2017-10-17 ENCOUNTER — Telehealth: Payer: Self-pay | Admitting: *Deleted

## 2017-10-17 ENCOUNTER — Ambulatory Visit
Admission: RE | Admit: 2017-10-17 | Discharge: 2017-10-17 | Disposition: A | Discharge status: Home / Self Care | Payer: Federal, State, Local not specified - PPO | Source: Ambulatory Visit | Attending: Radiation Oncology | Admitting: Radiation Oncology

## 2017-10-17 DIAGNOSIS — Z8052 Family history of malignant neoplasm of bladder: Secondary | ICD-10-CM | POA: Diagnosis not present

## 2017-10-17 DIAGNOSIS — Z87891 Personal history of nicotine dependence: Secondary | ICD-10-CM | POA: Diagnosis not present

## 2017-10-17 DIAGNOSIS — Z791 Long term (current) use of non-steroidal anti-inflammatories (NSAID): Secondary | ICD-10-CM | POA: Diagnosis not present

## 2017-10-17 DIAGNOSIS — Z801 Family history of malignant neoplasm of trachea, bronchus and lung: Secondary | ICD-10-CM | POA: Diagnosis not present

## 2017-10-17 DIAGNOSIS — Z79899 Other long term (current) drug therapy: Secondary | ICD-10-CM | POA: Diagnosis not present

## 2017-10-17 DIAGNOSIS — Z510 Encounter for antineoplastic radiation therapy: Secondary | ICD-10-CM | POA: Diagnosis not present

## 2017-10-17 DIAGNOSIS — C20 Malignant neoplasm of rectum: Secondary | ICD-10-CM | POA: Diagnosis not present

## 2017-10-17 MED ORDER — LORAZEPAM 1 MG PO TABS: 1.0000 mg | ORAL_TABLET | Freq: Three times a day (TID) | ORAL | 0 refills | Status: DC | PRN

## 2017-10-17 NOTE — Telephone Encounter (Signed)
Dr. Lisbeth Renshaw to e-script valium to patient pharmacy in [place], #2

## 2017-10-17 NOTE — Telephone Encounter (Signed)
error 

## 2017-10-20 ENCOUNTER — Ambulatory Visit (HOSPITAL_COMMUNITY)
Admission: RE | Admit: 2017-10-20 | Discharge: 2017-10-20 | Disposition: A | Discharge status: Home / Self Care | Payer: Federal, State, Local not specified - PPO | Source: Ambulatory Visit | Attending: Radiation Oncology | Admitting: Radiation Oncology

## 2017-10-20 DIAGNOSIS — C20 Malignant neoplasm of rectum: Secondary | ICD-10-CM | POA: Insufficient documentation

## 2017-10-20 DIAGNOSIS — N281 Cyst of kidney, acquired: Secondary | ICD-10-CM | POA: Diagnosis not present

## 2017-10-20 DIAGNOSIS — K7689 Other specified diseases of liver: Secondary | ICD-10-CM | POA: Insufficient documentation

## 2017-10-20 MED ORDER — GADOBENATE DIMEGLUMINE 529 MG/ML IV SOLN
20.0000 mL | Freq: Once | INTRAVENOUS | Status: AC | PRN
  Administered 2017-10-20: 20 mL via INTRAVENOUS

## 2017-10-22 ENCOUNTER — Encounter: Payer: Self-pay | Admitting: *Deleted

## 2017-10-22 ENCOUNTER — Other Ambulatory Visit: Payer: Federal, State, Local not specified - PPO

## 2017-10-22 ENCOUNTER — Other Ambulatory Visit: Payer: Self-pay

## 2017-10-22 ENCOUNTER — Encounter: Payer: Self-pay | Admitting: Oncology

## 2017-10-22 ENCOUNTER — Inpatient Hospital Stay: Payer: Federal, State, Local not specified - PPO | Attending: Oncology | Admitting: Oncology

## 2017-10-22 ENCOUNTER — Other Ambulatory Visit: Payer: Self-pay | Admitting: Pharmacist

## 2017-10-22 ENCOUNTER — Telehealth: Payer: Self-pay | Admitting: Pharmacist

## 2017-10-22 VITALS — BP 143/80 | HR 74 | Temp 98.1°F | Resp 17 | Ht 74.0 in | Wt 228.5 lb

## 2017-10-22 DIAGNOSIS — C20 Malignant neoplasm of rectum: Secondary | ICD-10-CM | POA: Diagnosis not present

## 2017-10-22 MED ORDER — CAPECITABINE 500 MG PO TABS: 2000.0000 mg | ORAL_TABLET | Freq: Two times a day (BID) | ORAL | 0 refills | Status: DC

## 2017-10-22 MED FILL — CAPECITABINE 500 MG TABLET: 500 | 30 days supply | Qty: 176 | Fill #0

## 2017-10-22 NOTE — Progress Notes (Signed)
  Oncology Nurse Navigator Documentation  Navigator Location: [facility] (10/22/17 1120) Referral date to RadOnc/MedOnc: 11/07/17 (10/22/17 1120) )Navigator Encounter Type: Initial MedOnc (10/22/17 1120)   Abnormal Finding Date: 11/03/17 (10/22/17 1120) Confirmed Diagnosis Date: 11/06/17 (10/22/17 1120)               Patient Visit Type: MedOnc;Initial (10/22/17 1120)   Barriers/Navigation Needs: Coordination of Care (10/22/17 1120)   Interventions: Coordination of Care (10/22/17 1120)  Met with patient during initial med/onc appointment. I introduced myself and my role as GI navigator and provided my contact information. I also told patient about the GI Support Group that meets the third Monday of each month. Patient verbalized understanding that they can call me with questions or concerns. I called Johny Drilling -PharmD to ask that she meet with patient after Chemo Ed class today to review oral chemo.          Acuity: Level 1 (10/22/17 1120) Acuity Level 1: Initial guidance, education and coordination as needed;Minimal follow up required (10/22/17 1120)       Time Spent with Patient: 15 (10/22/17 1120)

## 2017-10-22 NOTE — Telephone Encounter (Signed)
Oral Oncology Pharmacist Encounter  Spoke with patient and his wife after chemo education regarding new prescription for capecitabine for the treatment of rectal cancer in conjunction with radiation therapy, planned duration ~5 and 1/2 weeks. Therapy is in neoadjuvant setting.  Patient will take capecitabine 2000mg  PO BID within 30 minutes of a meal on days of radiation only (Mon-Fri) (825 mg/m2 PO BID). start date: 10/27/2017 with first day of radiation  Labs from 12/14 assessed, okay for treatment. CrCL ~109 mL/min.   Current medication list in Epic reviewed, no serious DDIs with capecitabine identified. He will only be taking his garlic and vitamin C during his treatment.   Side effects include but not limited to: nausea/vomiting/diarrhea, hand and foot syndrome, mucositis, fatigue, decreased blood counts.    Reviewed with patient importance of keeping a medication schedule and plan for any missed doses. We also discussed safe handling precautions.   His first fill will come from [facility] and will cost [phone] - we will call him to set up shipment today. His second and final fill will need to come from [facility]. He and his wife understood the specialty pharmacy process as she orders her medications from them too.   Patient knows to call the office with questions or concerns. Oral Oncology Clinic will continue to follow.  Demetrius Charity, PharmD PGY2 Oncology Pharmacy Resident  Pharmacy Phone: [phone] 10/22/2017

## 2017-10-22 NOTE — Progress Notes (Signed)
[place] Patient Consult   Referring MD: Arshdeep Bolger 57 y.o.  Dec 16, 1960    Reason for Referral: Rectal cancer   HPI: Mr. Selvidge underwent a screening colonoscopy on 10/03/2017.  A nonobstructing mass was noted in the mid rectum.  The mass was biopsied and the area was tattooed.  A 7 mm polyp was removed from the proximal sigmoid colon.  A 20 mm polyp was removed from the distal sigmoid colon.  Diverticula were noted in the sigmoid and ascending colon.  The pathology [phone]) revealed a tubular adenoma involving 1 of the sigmoid polyps.  The other sigmoid polyp had invasive colorectal adenocarcinoma involving a tubular adenoma.  The stalk margin was not involved by invasive carcinoma.  The rectum biopsy returned as invasive adenocarcinoma.  He was taken to a flexible sigmoidoscopy 10/08/2017 and a tattoo was placed proximal to the ulceration from the polypectomy at 22 cm from the dentate line.  No evidence of residual polyp.  CTs of the chest, abdomen, and pelvis 10/08/2017 revealed no suspicious pulmonary nodule or mass.  Tiny hypoattenuation lesions in the liver are too small to characterize.  No colonic lesion identified.  No pelvic adenopathy or perirectal lymph nodes.   A staging MRI of the abdomen/pelvis 10/20/2017-revealed benign left renal cysts.  Scattered hepatic cysts.  Polypoid rectal mass was noted without lymphadenopathy.  The tumor was measured at 9 cm from the and was noted to extend through the muscularis propria, T3.  He reports feeling well.  He had no symptoms prior to the colonoscopy.     Past Medical History:  Diagnosis Date  . Cancer ([place])    Sigmoid (arising in polyp) and Rectum    Past surgical history:-Wisdom tooth surgery as a youth  Medications: Reviewed  Allergies: No Known Allergies  Family history: His father had bladder cancer and was a non-smoker.  He died of "lung cancer ".  No other family history  of cancer.  Social History:   He lives in [place].  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  He uses smokeless tobacco, he reports social cigarette use in the past.  He has a history of heavy alcohol use, quit using alcohol heavily 2-3 years ago, currently has approximately 1-2 beers per month  ROS:   Positives include: Recent upper respiratory infection with a cough and congestion, low-grade fever  A complete ROS was otherwise negative.  Physical Exam:  Blood pressure (!) 143/80, pulse 74, temperature 98.1 F (36.7 C), temperature source Oral, resp. rate 17, height 6\' 2"  (1.88 m), weight 228 lb 8 oz (103.6 kg), SpO2 98 %.  HEENT: Oropharynx without visible mass, neck without mass Lungs: Clear bilaterally Cardiac: Regular rate and rhythm Abdomen: No hepatomegaly, nontender, no mass GU: Testes without mass Vascular: No leg edema Lymph nodes: No cervical, supraclavicular, axillary, or inguinal nodes Neurologic: Alert and oriented, the motor exam appears intact in the upper and lower extremities Skin: No rash Musculoskeletal: No spine tenderness   LAB:  CBC  Lab Results  Component Value Date   WBC 5.7 10/03/2017   HGB 14.7 10/03/2017   HCT 44.6 10/03/2017   MCV 86.6 10/03/2017   PLT 283.0 10/03/2017   NEUTROABS 3.6 10/03/2017        CMP     Component Value Date/Time   NA 140 10/03/2017 0928   K 4.6 10/03/2017 0928   CL 102 10/03/2017 0928  CO2 31 10/03/2017 0928   GLUCOSE 94 10/03/2017 0928   BUN 10 10/03/2017 0928   CREATININE 1.11 10/03/2017 0928   CALCIUM 8.9 10/03/2017 0928   PROT 6.8 10/03/2017 0928   ALBUMIN 4.3 10/03/2017 0928   AST 17 10/03/2017 0928   ALT 17 10/03/2017 0928   ALKPHOS 58 10/03/2017 0928   BILITOT 0.7 10/03/2017 0928      Imaging:  As per HPI   Assessment/Plan:   1. Rectal cancer-invasive adenocarcinoma confirmed on biopsy of a mid rectal mass 10/03/2017  Staging CTs from  10/08/2017, indeterminate liver lesions, no rectal mass identified.  No lymphadenopathy.  MRI abdomen/pelvis 10/20/2017-no suspicious liver lesions, T3 N0 M0 staging with tumor measured at 9 cm from the anal sphincter 2.    Distal sigmoid polyp on colonoscopy 10/03/2017-invasive adenocarcinoma involving a tubular adenoma, invasive carcinoma measured 1.1 cm with no lymphovascular space involvement and negative resection margin        Flexible sigmoidoscopy 10/08/2017-no residual polyp identified, area of previous polyp at 22 cm tattooed   Disposition:   Mr. Borba has been diagnosed with rectal cancer.  He has clinical stage II disease.  He saw Dr. Lisbeth Renshaw and is scheduled to begin neoadjuvant radiation 10/27/2017.  I recommend concurrent capecitabine.  I discussed the rationale for neoadjuvant therapy with Ms. Savage and his wife.  We will review the surgical pathology from the rectum resection and decide on the indication for adjuvant systemic therapy.  We reviewed the potential toxicities associated with capecitabine including the chance for nausea, diarrhea, mucositis, and hematologic toxicity.  We discussed the sun sensitivity, rash, hyperpigmentation, and hand/foot syndrome associated with capecitabine.  He agrees to proceed.  He will attended a chemotherapy teaching class.  We will obtain a baseline CEA when he returns for an office visit 11/07/2016.  He understands his family members are at increased risk of developing colorectal cancer and should receive appropriate screening.  The resected tumor will be tested for HNPCC.  50 minutes were spent with the patient today.  The majority of the time was used for counseling and coordination of care. Betsy Coder, MD  10/22/2017, 6:41 PM

## 2017-10-22 NOTE — Progress Notes (Signed)
Patient here for new patient visit. He has a cold, with productive cough.

## 2017-10-23 NOTE — Progress Notes (Signed)
  Radiation Oncology         [phone]) [phone] ________________________________  Name: Jeffrey Nolan MRN: [id number]  Date: 10/17/2017  DOB: 1960/11/07  Optical Surface Tracking Plan:  Since intensity modulated radiotherapy (IMRT) and 3D conformal radiation treatment methods are predicated on accurate and precise positioning for treatment, intrafraction motion monitoring is medically necessary to ensure accurate and safe treatment delivery.  The ability to quantify intrafraction motion without excessive ionizing radiation dose can only be performed with optical surface tracking. Accordingly, surface imaging offers the opportunity to obtain 3D measurements of patient position throughout IMRT and 3D treatments without excessive radiation exposure.  I am ordering optical surface tracking for this patient's upcoming course of radiotherapy. ________________________________  Kyung Rudd, MD 10/23/2017 6:58 PM    Reference:   Particia Jasper, et al. Surface imaging-based analysis of intrafraction motion for breast radiotherapy patients.Journal of [place], n. 6, nov. 2014. ISSN [id number].   Available at: <http://www.jacmp.org/index.php/jacmp/article/view/4957>.

## 2017-10-23 NOTE — Progress Notes (Signed)
  Radiation Oncology         [phone]) [phone] ________________________________  Name: Jeffrey Nolan MRN: [id number]  Date: 10/17/2017  DOB: 02/23/1961   SIMULATION AND TREATMENT PLANNING NOTE  DIAGNOSIS:     ICD-10-CM   1. Rectal adenocarcinoma ([place]) C20      The patient presented for simulation for the patient's upcoming course of radiation for the diagnosis of rectal cancer. The patient was placed in a supine position. A customized vac-lock bag was constructed to aid in patient immobilization on. This complex treatment device will be used on a daily basis during the treatment. In this fashion a CT scan was obtained through the pelvic region and the isocenter was placed near midline within the pelvis. Surface markings were placed.  The patient's imaging was loaded into the radiation treatment planning system. The patient will initially be planned to receive a course of radiation to a dose of 45 Gy. This will be accomplished in 25 fractions at 1.8 gray per fraction. This initial treatment will correspond to a 3-D conformal technique. The target has been contoured in addition to the rectum, bladder and femoral heads. Dose volume histograms of each of these structures have been requested and these will be carefully reviewed as part of the 3-D conformal treatment planning process. To accomplish this initial treatment, 4 customized blocks have been designed for this purpose. Each of these 4 complex treatment devices will be used on a daily basis during the initial course of the treatment. It is anticipated that the patient will then receive a boost for an additional 5.4 Gy. The anticipated total dose therefore will be 50.4 Gy.    Special treatment procedure The patient will receive chemotherapy during the course of radiation treatment. The patient may experience increased or overlapping toxicity due to this combined-modality approach and the patient will be monitored for such problems. This may  include extra lab work as necessary. This therefore constitutes a special treatment procedure.    ________________________________  Jodelle Gross, MD, PhD

## 2017-10-24 ENCOUNTER — Encounter: Payer: Self-pay | Admitting: *Deleted

## 2017-10-24 DIAGNOSIS — Z79899 Other long term (current) drug therapy: Secondary | ICD-10-CM | POA: Diagnosis not present

## 2017-10-24 DIAGNOSIS — Z8052 Family history of malignant neoplasm of bladder: Secondary | ICD-10-CM | POA: Diagnosis not present

## 2017-10-24 DIAGNOSIS — Z87891 Personal history of nicotine dependence: Secondary | ICD-10-CM | POA: Diagnosis not present

## 2017-10-24 DIAGNOSIS — Z791 Long term (current) use of non-steroidal anti-inflammatories (NSAID): Secondary | ICD-10-CM | POA: Diagnosis not present

## 2017-10-24 DIAGNOSIS — Z510 Encounter for antineoplastic radiation therapy: Secondary | ICD-10-CM | POA: Diagnosis not present

## 2017-10-24 DIAGNOSIS — Z801 Family history of malignant neoplasm of trachea, bronchus and lung: Secondary | ICD-10-CM | POA: Diagnosis not present

## 2017-10-24 DIAGNOSIS — C20 Malignant neoplasm of rectum: Secondary | ICD-10-CM | POA: Diagnosis not present

## 2017-10-27 ENCOUNTER — Ambulatory Visit
Admission: RE | Admit: 2017-10-27 | Discharge: 2017-10-27 | Disposition: A | Discharge status: Home / Self Care | Payer: Federal, State, Local not specified - PPO | Source: Ambulatory Visit | Attending: Radiation Oncology | Admitting: Radiation Oncology

## 2017-10-27 DIAGNOSIS — Z510 Encounter for antineoplastic radiation therapy: Secondary | ICD-10-CM | POA: Diagnosis not present

## 2017-10-27 DIAGNOSIS — Z87891 Personal history of nicotine dependence: Secondary | ICD-10-CM | POA: Diagnosis not present

## 2017-10-27 DIAGNOSIS — C20 Malignant neoplasm of rectum: Secondary | ICD-10-CM | POA: Diagnosis not present

## 2017-10-27 DIAGNOSIS — Z79899 Other long term (current) drug therapy: Secondary | ICD-10-CM | POA: Diagnosis not present

## 2017-10-27 DIAGNOSIS — Z791 Long term (current) use of non-steroidal anti-inflammatories (NSAID): Secondary | ICD-10-CM | POA: Diagnosis not present

## 2017-10-27 DIAGNOSIS — Z801 Family history of malignant neoplasm of trachea, bronchus and lung: Secondary | ICD-10-CM | POA: Diagnosis not present

## 2017-10-27 DIAGNOSIS — Z8052 Family history of malignant neoplasm of bladder: Secondary | ICD-10-CM | POA: Diagnosis not present

## 2017-10-28 ENCOUNTER — Telehealth: Payer: Self-pay

## 2017-10-28 ENCOUNTER — Ambulatory Visit
Admission: RE | Admit: 2017-10-28 | Discharge: 2017-10-28 | Disposition: A | Discharge status: Home / Self Care | Payer: Federal, State, Local not specified - PPO | Source: Ambulatory Visit | Attending: Radiation Oncology | Admitting: Radiation Oncology

## 2017-10-28 DIAGNOSIS — J324 Chronic pansinusitis: Secondary | ICD-10-CM | POA: Diagnosis not present

## 2017-10-28 DIAGNOSIS — Z79899 Other long term (current) drug therapy: Secondary | ICD-10-CM | POA: Diagnosis not present

## 2017-10-28 DIAGNOSIS — Z791 Long term (current) use of non-steroidal anti-inflammatories (NSAID): Secondary | ICD-10-CM | POA: Diagnosis not present

## 2017-10-28 DIAGNOSIS — Z87891 Personal history of nicotine dependence: Secondary | ICD-10-CM | POA: Diagnosis not present

## 2017-10-28 DIAGNOSIS — Z8052 Family history of malignant neoplasm of bladder: Secondary | ICD-10-CM | POA: Diagnosis not present

## 2017-10-28 DIAGNOSIS — Z510 Encounter for antineoplastic radiation therapy: Secondary | ICD-10-CM | POA: Diagnosis not present

## 2017-10-28 DIAGNOSIS — Z801 Family history of malignant neoplasm of trachea, bronchus and lung: Secondary | ICD-10-CM | POA: Diagnosis not present

## 2017-10-28 DIAGNOSIS — C20 Malignant neoplasm of rectum: Secondary | ICD-10-CM | POA: Diagnosis not present

## 2017-10-28 NOTE — Telephone Encounter (Signed)
Wife called to inform that patient has "had a cold for about two weeks now". Stated that now "theres blood when he blows his nose" and "tightness in his chest" described as "his ribs are sore from coughing". Patient wife also notes that he is coughing up green phlegm. Patient went to urgent care per his wife. Made Dr. Benay Spice aware. Wife knows to call with any additional concerns.

## 2017-10-29 ENCOUNTER — Telehealth: Payer: Self-pay | Admitting: Oncology

## 2017-10-29 ENCOUNTER — Ambulatory Visit
Admission: RE | Admit: 2017-10-29 | Discharge: 2017-10-29 | Disposition: A | Discharge status: Home / Self Care | Payer: Federal, State, Local not specified - PPO | Source: Ambulatory Visit | Attending: Radiation Oncology | Admitting: Radiation Oncology

## 2017-10-29 ENCOUNTER — Telehealth: Payer: Self-pay | Admitting: *Deleted

## 2017-10-29 DIAGNOSIS — Z79899 Other long term (current) drug therapy: Secondary | ICD-10-CM | POA: Diagnosis not present

## 2017-10-29 DIAGNOSIS — Z510 Encounter for antineoplastic radiation therapy: Secondary | ICD-10-CM | POA: Diagnosis not present

## 2017-10-29 DIAGNOSIS — C20 Malignant neoplasm of rectum: Secondary | ICD-10-CM | POA: Diagnosis not present

## 2017-10-29 DIAGNOSIS — Z791 Long term (current) use of non-steroidal anti-inflammatories (NSAID): Secondary | ICD-10-CM | POA: Diagnosis not present

## 2017-10-29 DIAGNOSIS — Z8052 Family history of malignant neoplasm of bladder: Secondary | ICD-10-CM | POA: Diagnosis not present

## 2017-10-29 DIAGNOSIS — Z801 Family history of malignant neoplasm of trachea, bronchus and lung: Secondary | ICD-10-CM | POA: Diagnosis not present

## 2017-10-29 DIAGNOSIS — Z87891 Personal history of nicotine dependence: Secondary | ICD-10-CM | POA: Diagnosis not present

## 2017-10-29 MED ORDER — PROCHLORPERAZINE MALEATE 10 MG PO TABS: 10.0000 mg | ORAL_TABLET | Freq: Four times a day (QID) | ORAL | 0 refills | Status: DC | PRN

## 2017-10-29 NOTE — Telephone Encounter (Signed)
Call discussed with Dr. Benay Spice: Stop doxycycline if not diagnosed with pneumonia. Order received for Compazine 10mg  Q6h PRN nausea. OK to take Tylenol for headache.  Returned call to wife with above instructions. She voiced understanding. She reports they were told "his lungs were clear." Instructed her to have pt push PO fluids, call office if he develops fever or dyspnea.

## 2017-10-29 NOTE — Telephone Encounter (Signed)
Call from pt's wife reporting nausea that is not relieved with Zofran. Pt was seen in urgent care for productive cough with green sputum and prescribed doxycycline. Nausea began after starting antibiotic.  Pt also reports mild headache that began before he started taking Zofran. Denies any vision or gait changes. Pt denied fever or dyspnea. Requesting different antiemetic.

## 2017-10-29 NOTE — Telephone Encounter (Signed)
Spoke to patient regarding upcoming January appointments.  °

## 2017-10-30 ENCOUNTER — Ambulatory Visit
Admission: RE | Admit: 2017-10-30 | Discharge: 2017-10-30 | Disposition: A | Discharge status: Home / Self Care | Payer: Federal, State, Local not specified - PPO | Source: Ambulatory Visit | Attending: Radiation Oncology | Admitting: Radiation Oncology

## 2017-10-30 DIAGNOSIS — Z79899 Other long term (current) drug therapy: Secondary | ICD-10-CM | POA: Diagnosis not present

## 2017-10-30 DIAGNOSIS — Z87891 Personal history of nicotine dependence: Secondary | ICD-10-CM | POA: Diagnosis not present

## 2017-10-30 DIAGNOSIS — Z510 Encounter for antineoplastic radiation therapy: Secondary | ICD-10-CM | POA: Diagnosis not present

## 2017-10-30 DIAGNOSIS — Z8052 Family history of malignant neoplasm of bladder: Secondary | ICD-10-CM | POA: Diagnosis not present

## 2017-10-30 DIAGNOSIS — Z801 Family history of malignant neoplasm of trachea, bronchus and lung: Secondary | ICD-10-CM | POA: Diagnosis not present

## 2017-10-30 DIAGNOSIS — C20 Malignant neoplasm of rectum: Secondary | ICD-10-CM | POA: Diagnosis not present

## 2017-10-30 DIAGNOSIS — Z791 Long term (current) use of non-steroidal anti-inflammatories (NSAID): Secondary | ICD-10-CM | POA: Diagnosis not present

## 2017-10-31 ENCOUNTER — Ambulatory Visit
Admission: RE | Admit: 2017-10-31 | Discharge: 2017-10-31 | Disposition: A | Discharge status: Home / Self Care | Payer: Federal, State, Local not specified - PPO | Source: Ambulatory Visit | Attending: Radiation Oncology | Admitting: Radiation Oncology

## 2017-10-31 DIAGNOSIS — Z79899 Other long term (current) drug therapy: Secondary | ICD-10-CM | POA: Diagnosis not present

## 2017-10-31 DIAGNOSIS — Z791 Long term (current) use of non-steroidal anti-inflammatories (NSAID): Secondary | ICD-10-CM | POA: Diagnosis not present

## 2017-10-31 DIAGNOSIS — C20 Malignant neoplasm of rectum: Secondary | ICD-10-CM | POA: Diagnosis not present

## 2017-10-31 DIAGNOSIS — Z801 Family history of malignant neoplasm of trachea, bronchus and lung: Secondary | ICD-10-CM | POA: Diagnosis not present

## 2017-10-31 DIAGNOSIS — Z87891 Personal history of nicotine dependence: Secondary | ICD-10-CM | POA: Diagnosis not present

## 2017-10-31 DIAGNOSIS — Z8052 Family history of malignant neoplasm of bladder: Secondary | ICD-10-CM | POA: Diagnosis not present

## 2017-10-31 DIAGNOSIS — Z510 Encounter for antineoplastic radiation therapy: Secondary | ICD-10-CM | POA: Diagnosis not present

## 2017-11-03 ENCOUNTER — Ambulatory Visit
Admission: RE | Admit: 2017-11-03 | Discharge: 2017-11-03 | Disposition: A | Discharge status: Home / Self Care | Payer: Federal, State, Local not specified - PPO | Source: Ambulatory Visit | Attending: Radiation Oncology | Admitting: Radiation Oncology

## 2017-11-03 DIAGNOSIS — Z79899 Other long term (current) drug therapy: Secondary | ICD-10-CM | POA: Diagnosis not present

## 2017-11-03 DIAGNOSIS — Z87891 Personal history of nicotine dependence: Secondary | ICD-10-CM | POA: Diagnosis not present

## 2017-11-03 DIAGNOSIS — Z801 Family history of malignant neoplasm of trachea, bronchus and lung: Secondary | ICD-10-CM | POA: Diagnosis not present

## 2017-11-03 DIAGNOSIS — Z8052 Family history of malignant neoplasm of bladder: Secondary | ICD-10-CM | POA: Diagnosis not present

## 2017-11-03 DIAGNOSIS — Z791 Long term (current) use of non-steroidal anti-inflammatories (NSAID): Secondary | ICD-10-CM | POA: Diagnosis not present

## 2017-11-03 DIAGNOSIS — Z510 Encounter for antineoplastic radiation therapy: Secondary | ICD-10-CM | POA: Diagnosis not present

## 2017-11-03 DIAGNOSIS — C20 Malignant neoplasm of rectum: Secondary | ICD-10-CM | POA: Diagnosis not present

## 2017-11-04 ENCOUNTER — Emergency Department (HOSPITAL_COMMUNITY): Payer: Federal, State, Local not specified - PPO

## 2017-11-04 ENCOUNTER — Ambulatory Visit
Admission: RE | Admit: 2017-11-04 | Discharge: 2017-11-04 | Disposition: A | Discharge status: Home / Self Care | Payer: Federal, State, Local not specified - PPO | Source: Ambulatory Visit | Attending: Radiation Oncology | Admitting: Radiation Oncology

## 2017-11-04 ENCOUNTER — Inpatient Hospital Stay (HOSPITAL_COMMUNITY)
Admission: EM | Admit: 2017-11-04 | Discharge: 2017-11-09 | DRG: 392 | Disposition: A | Discharge status: Home / Self Care | Payer: Federal, State, Local not specified - PPO | Attending: Internal Medicine | Admitting: Internal Medicine

## 2017-11-04 ENCOUNTER — Encounter: Payer: Self-pay | Admitting: Oncology

## 2017-11-04 ENCOUNTER — Other Ambulatory Visit: Payer: Self-pay

## 2017-11-04 DIAGNOSIS — R05 Cough: Secondary | ICD-10-CM | POA: Diagnosis not present

## 2017-11-04 DIAGNOSIS — C20 Malignant neoplasm of rectum: Secondary | ICD-10-CM | POA: Diagnosis present

## 2017-11-04 DIAGNOSIS — K5900 Constipation, unspecified: Secondary | ICD-10-CM | POA: Diagnosis present

## 2017-11-04 DIAGNOSIS — K5720 Diverticulitis of large intestine with perforation and abscess without bleeding: Secondary | ICD-10-CM | POA: Diagnosis not present

## 2017-11-04 DIAGNOSIS — Z87891 Personal history of nicotine dependence: Secondary | ICD-10-CM

## 2017-11-04 DIAGNOSIS — R509 Fever, unspecified: Secondary | ICD-10-CM | POA: Diagnosis not present

## 2017-11-04 DIAGNOSIS — Z801 Family history of malignant neoplasm of trachea, bronchus and lung: Secondary | ICD-10-CM | POA: Diagnosis not present

## 2017-11-04 DIAGNOSIS — Z791 Long term (current) use of non-steroidal anti-inflammatories (NSAID): Secondary | ICD-10-CM | POA: Diagnosis not present

## 2017-11-04 DIAGNOSIS — D72829 Elevated white blood cell count, unspecified: Secondary | ICD-10-CM | POA: Diagnosis not present

## 2017-11-04 DIAGNOSIS — Z79899 Other long term (current) drug therapy: Secondary | ICD-10-CM | POA: Diagnosis not present

## 2017-11-04 DIAGNOSIS — Z8052 Family history of malignant neoplasm of bladder: Secondary | ICD-10-CM | POA: Diagnosis not present

## 2017-11-04 DIAGNOSIS — E876 Hypokalemia: Secondary | ICD-10-CM | POA: Diagnosis not present

## 2017-11-04 DIAGNOSIS — R109 Unspecified abdominal pain: Secondary | ICD-10-CM | POA: Diagnosis not present

## 2017-11-04 DIAGNOSIS — Z510 Encounter for antineoplastic radiation therapy: Secondary | ICD-10-CM | POA: Diagnosis not present

## 2017-11-04 HISTORY — DX: Malignant neoplasm of rectum: C20

## 2017-11-04 LAB — URINALYSIS, ROUTINE W REFLEX MICROSCOPIC
Bacteria, UA: NONE SEEN
Bilirubin Urine: NEGATIVE
Glucose, UA: NEGATIVE mg/dL
Ketones, ur: NEGATIVE mg/dL
Leukocytes, UA: NEGATIVE
Nitrite: NEGATIVE
Protein, ur: NEGATIVE mg/dL
Specific Gravity, Urine: 1.013 (ref 1.005–1.030)
Squamous Epithelial / HPF: NONE SEEN
WBC, UA: NONE SEEN WBC/hpf (ref 0–5)
pH: 6 (ref 5.0–8.0)

## 2017-11-04 LAB — CBC WITH DIFFERENTIAL/PLATELET
BASOS ABS: 0 10*3/uL (ref 0.0–0.1)
Basophils Relative: 0 %
Eosinophils Absolute: 0 10*3/uL (ref 0.0–0.7)
Eosinophils Relative: 0 %
HEMATOCRIT: 39.2 % (ref 39.0–52.0)
Hemoglobin: 13 g/dL (ref 13.0–17.0)
LYMPHS ABS: 0.8 10*3/uL (ref 0.7–4.0)
LYMPHS PCT: 7 %
MCH: 28.6 pg (ref 26.0–34.0)
MCHC: 33.2 g/dL (ref 30.0–36.0)
MCV: 86.3 fL (ref 78.0–100.0)
MONO ABS: 1 10*3/uL (ref 0.1–1.0)
MONOS PCT: 9 %
NEUTROS ABS: 9.4 10*3/uL — AB (ref 1.7–7.7)
Neutrophils Relative %: 84 %
PLATELETS: 229 10*3/uL (ref 150–400)
RBC: 4.54 MIL/uL (ref 4.22–5.81)
RDW: 12.8 % (ref 11.5–15.5)
WBC: 11.3 10*3/uL — ABNORMAL HIGH (ref 4.0–10.5)

## 2017-11-04 LAB — COMPREHENSIVE METABOLIC PANEL WITH GFR
ALT: 24 U/L (ref 17–63)
AST: 21 U/L (ref 15–41)
Albumin: 3.7 g/dL (ref 3.5–5.0)
Alkaline Phosphatase: 65 U/L (ref 38–126)
Anion gap: 8 (ref 5–15)
BUN: 19 mg/dL (ref 6–20)
CO2: 26 mmol/L (ref 22–32)
Calcium: 8.7 mg/dL — ABNORMAL LOW (ref 8.9–10.3)
Chloride: 103 mmol/L (ref 101–111)
Creatinine, Ser: 1.01 mg/dL (ref 0.61–1.24)
GFR calc Af Amer: >60 mL/min
GFR calc non Af Amer: >60 mL/min
Glucose, Bld: 106 mg/dL — ABNORMAL HIGH (ref 65–99)
Potassium: 3.8 mmol/L (ref 3.5–5.1)
Sodium: 137 mmol/L (ref 135–145)
Total Bilirubin: 1 mg/dL (ref 0.3–1.2)
Total Protein: 6.8 g/dL (ref 6.5–8.1)

## 2017-11-04 LAB — INFLUENZA PANEL BY PCR (TYPE A & B)
Influenza A By PCR: NEGATIVE
Influenza B By PCR: NEGATIVE

## 2017-11-04 LAB — I-STAT CG4 LACTIC ACID, ED: Lactic Acid, Venous: 0.99 mmol/L (ref 0.5–1.9)

## 2017-11-04 LAB — LIPASE, BLOOD: Lipase: 22 U/L (ref 11–51)

## 2017-11-04 MED ORDER — IOPAMIDOL (ISOVUE-300) INJECTION 61%
INTRAVENOUS | Status: AC
  Filled 2017-11-04: qty 100

## 2017-11-04 MED ORDER — IOPAMIDOL (ISOVUE-300) INJECTION 61%
100.0000 mL | Freq: Once | INTRAVENOUS | Status: AC | PRN
  Administered 2017-11-04: 100 mL via INTRAVENOUS

## 2017-11-04 MED ORDER — SODIUM CHLORIDE 0.9 % IV BOLUS (SEPSIS)
1000.0000 mL | Freq: Once | INTRAVENOUS | Status: AC
  Administered 2017-11-04: 1000 mL via INTRAVENOUS

## 2017-11-04 NOTE — ED Triage Notes (Signed)
[facility] Pt reports 9/10 sharp bladder pain, pt denies dysuria. Pt reports fever at home of 100.6, pt arrives afebrile here. Pt chemo treatments are M thru F. Pt reports completeing chemo today w/o incident today. Pt denies chills. Pt reports expected nausea from chemo treatments. Pt A+OX4, speaking in complete sentences, ambulatory independently.

## 2017-11-04 NOTE — ED Provider Notes (Signed)
[facility] DEPT Provider Note   CSN: [id number] Arrival date & time: 11/04/17  2054     History   Chief Complaint Chief Complaint  Patient presents with  . CHEMO CARD  . Bladder Pain    HPI Jeffrey Nolan is a 57 y.o. male.  HPI  57 year old male with a history of rectal adenocarcinoma diagnosed late last year currently on radiation and oral chemotherapy presents with abdominal pain.  The abdominal pain started 2 days ago.  It is in his lower abdomen and his abdomen has become swollen.  He has chronic nausea and vomiting with the chemotherapy but no new nausea or vomiting.  He has been having dysuria but no hematuria.  He is frequently urinating.  He states that whenever he starts to urinate it causes abdominal pain but then urinating is normal.  There is no penile pain.  No back pain.  He developed a temperature of 100.6 so he came into the ER for evaluation.  He has had a cough/cold like symptoms for a couple weeks.  He was put on antibiotics by urgent care but stopped the next day at his oncologist discretion due to nausea and vomiting.  He is still intermittently coughing up some sputum.  No shortness of breath.  Since radiation he has been having about 3 semi-loose stools per day.  Today he had an episode of watery stool/diarrhea.  No blood in his stool. Patient's abdominal pain is worse with walking, sitting up or any type of movement.  Past Medical History:  Diagnosis Date  . Cancer ([employer])    Sigmoid (arising in polyp) and Rectum    Patient Active Problem List   Diagnosis Date Noted  . Rectal adenocarcinoma ([place]) 10/15/2017    No past surgical history on file.     Home Medications    Prior to Admission medications   Medication Sig Start Date End Date Taking? Authorizing Provider  capecitabine (XELODA) 500 MG tablet Take 4 tablets (2,000 mg total) by mouth 2 (two) times daily after a meal. Take on days of radiation Mon-Fri only. 10/22/17   Yes Ladell Pier, MD  prochlorperazine (COMPAZINE) 10 MG tablet Take 1 tablet (10 mg total) by mouth every 6 (six) hours as needed for nausea or vomiting. 10/29/17  Yes Ladell Pier, MD  sildenafil (REVATIO) 20 MG tablet TAKE 3 TABLETS BY MOUTH AS DIRECTED 06/30/17   [provider]    Family History Family History  Problem Relation Age of Onset  . Bladder Cancer Father   . Lung cancer Father   . Colon cancer Neg Hx   . Esophageal cancer Neg Hx   . Pancreatic cancer Neg Hx   . Rectal cancer Neg Hx   . Stomach cancer Neg Hx     Social History Social History   Tobacco Use  . Smoking status: Former Research scientist (life sciences)  . Smokeless tobacco: Former Network engineer Use Topics  . Alcohol use: Yes    Comment: Rare Beer  . Drug use: No     Allergies   Patient has no known allergies.   Review of Systems Review of Systems  Constitutional: Positive for fever.  Respiratory: Positive for cough. Negative for shortness of breath.   Gastrointestinal: Positive for abdominal pain and diarrhea. Negative for constipation.  All other systems reviewed and are negative.    Physical Exam Updated Vital Signs BP (!) 150/84 (BP Location: Left Arm)   Pulse 92   Temp 99.1  F (37.3 C) (Oral)   Resp 19   SpO2 100%   Physical Exam  Constitutional: He is oriented to person, place, and time. He appears well-developed and well-nourished. No distress.  HENT:  Head: Normocephalic and atraumatic.  Right Ear: External ear normal.  Left Ear: External ear normal.  Nose: Nose normal.  Eyes: Right eye exhibits no discharge. Left eye exhibits no discharge.  Neck: Neck supple.  Cardiovascular: Normal rate, regular rhythm and normal heart sounds.  Pulmonary/Chest: Effort normal and breath sounds normal.  Abdominal: Soft. There is tenderness in the right lower quadrant, periumbilical area, suprapubic area and left lower quadrant. There is no CVA tenderness.  Musculoskeletal: He exhibits no edema.    Neurological: He is alert and oriented to person, place, and time.  Skin: Skin is warm and dry. He is not diaphoretic.  Nursing note and vitals reviewed.    ED Treatments / Results  Labs (all labs ordered are listed, but only abnormal results are displayed) Labs Reviewed  URINALYSIS, ROUTINE W REFLEX MICROSCOPIC - Abnormal; Notable for the following components:      Result Value   Hgb urine dipstick SMALL (*)    All other components within normal limits  COMPREHENSIVE METABOLIC PANEL - Abnormal; Notable for the following components:   Glucose, Bld 106 (*)    Calcium 8.7 (*)    All other components within normal limits  CBC WITH DIFFERENTIAL/PLATELET - Abnormal; Notable for the following components:   WBC 11.3 (*)    Neutro Abs 9.4 (*)    All other components within normal limits  CULTURE, BLOOD (ROUTINE X 2)  CULTURE, BLOOD (ROUTINE X 2)  LIPASE, BLOOD  INFLUENZA PANEL BY PCR (TYPE A & B)  I-STAT CG4 LACTIC ACID, ED    EKG  EKG Interpretation None       Radiology Dg Chest 2 View  Result Date: 11/04/2017 CLINICAL DATA:  57 year old male with cough and fever. EXAM: CHEST  2 VIEW COMPARISON:  Chest CT dated 10/08/2017 FINDINGS: The heart size and mediastinal contours are within normal limits. Both lungs are clear. The visualized skeletal structures are unremarkable. IMPRESSION: No active cardiopulmonary disease. Electronically Signed   By: Anner Crete M.D.   On: 11/04/2017 22:17   Ct Abdomen Pelvis W Contrast  Result Date: 11/05/2017 CLINICAL DATA:  57 year old male with abdominal pain. History of rectal adenocarcinoma. EXAM: CT ABDOMEN AND PELVIS WITH CONTRAST TECHNIQUE: Multidetector CT imaging of the abdomen and pelvis was performed using the standard protocol following bolus administration of intravenous contrast. CONTRAST:  124mL ISOVUE-300 IOPAMIDOL (ISOVUE-300) INJECTION 61% COMPARISON:  Pelvic MRI dated 10/20/2017 and abdominal CT dated 10/08/2017 FINDINGS:  Lower chest: The visualized lung bases are clear. There is a small calcified granuloma in the right posterior costophrenic angle. No intra-abdominal free air or free fluid. Hepatobiliary: Probable mild fatty infiltration of the liver. Small scattered hepatic hypodense lesions, not characterized on this CT corresponding to the cysts noted the prior MRI. These are stable compared to the CT of 10/08/2017. No intrahepatic biliary ductal dilatation. The gallbladder is unremarkable. Pancreas: Unremarkable. No pancreatic ductal dilatation or surrounding inflammatory changes. Spleen: Normal in size without focal abnormality. Adrenals/Urinary Tract: The adrenal glands are unremarkable. Left renal cysts better seen on the prior MRI. The kidneys are otherwise unremarkable. There is symmetric enhancement and excretion of contrast by both kidneys. The visualized ureters and urinary bladder appear unremarkable. Stomach/Bowel: There is sigmoid diverticulosis without active inflammatory changes compatible with acute diverticulitis. Focally  contained extraluminal air adjacent to the sigmoid colon. No diverticular abscess. No bowel obstruction. The known rectal mass is not evaluated on today's exam. No perirectal adenopathy. Normal appendix. Vascular/Lymphatic: Mild aortoiliac atherosclerotic disease. The abdominal aorta and IVC are otherwise unremarkable. No portal venous gas. There is no adenopathy. Reproductive: The prostate and seminal vesicles are grossly unremarkable. Other: Small fat containing umbilical hernia.  No fluid collection. Musculoskeletal: Grade 1 L4-L5 anterolisthesis. No acute osseous pathology. IMPRESSION: 1. Sigmoid diverticulitis with focally contained diverticular perforation. No abscess. 2. No bowel obstruction.  Normal appendix. 3. Small hepatic and left renal cysts. 4.  Aortic Atherosclerosis (ICD10-I70.0). These results were called by telephone at the time of interpretation on 11/05/2017 at 12:17 am to Dr.  Sherwood Gambler , who verbally acknowledged these results. Electronically Signed   By: Anner Crete M.D.   On: 11/05/2017 00:19    Procedures Procedures (including critical care time)  Medications Ordered in ED Medications  piperacillin-tazobactam (ZOSYN) IVPB 3.375 g (3.375 g Intravenous New Bag/Given 11/05/17 0033)  sodium chloride 0.9 % bolus 1,000 mL (0 mLs Intravenous Stopped 11/05/17 0043)  iopamidol (ISOVUE-300) 61 % injection 100 mL (100 mLs Intravenous Contrast Given 11/04/17 2353)  morphine 4 MG/ML injection 4 mg (4 mg Intravenous Given 11/05/17 0041)     Initial Impression / Assessment and Plan / ED Course  I have reviewed the triage vital signs and the nursing notes.  Pertinent labs & imaging results that were available during my care of the patient were reviewed by me and considered in my medical decision making (see chart for details).     Fever and abdominal pain appears to be coming from the diverticulitis.  There is a local perforation but no pneumoperitoneum or abscess.  He was given IV Zosyn.  At first he declines IV fever or oral pain medicine but now relents given continued pain.  Admit to Dr. Tamala Julian, hospitalist service.  He requested consult general surgery. Discussed with Dr. Excell Seltzer, who will see in AM.  Final Clinical Impressions(s) / ED Diagnoses   Final diagnoses:  Perforation of sigmoid colon due to diverticulitis    ED Discharge Orders    None       Sherwood Gambler, MD 11/05/17 [phone]

## 2017-11-04 NOTE — ED Notes (Signed)
Patient transported to CT 

## 2017-11-05 ENCOUNTER — Other Ambulatory Visit: Payer: Self-pay

## 2017-11-05 ENCOUNTER — Ambulatory Visit: Payer: Federal, State, Local not specified - PPO

## 2017-11-05 DIAGNOSIS — Z87891 Personal history of nicotine dependence: Secondary | ICD-10-CM | POA: Diagnosis not present

## 2017-11-05 DIAGNOSIS — C20 Malignant neoplasm of rectum: Secondary | ICD-10-CM | POA: Diagnosis not present

## 2017-11-05 DIAGNOSIS — D72829 Elevated white blood cell count, unspecified: Secondary | ICD-10-CM | POA: Diagnosis present

## 2017-11-05 DIAGNOSIS — K5720 Diverticulitis of large intestine with perforation and abscess without bleeding: Secondary | ICD-10-CM | POA: Diagnosis not present

## 2017-11-05 DIAGNOSIS — E876 Hypokalemia: Secondary | ICD-10-CM | POA: Diagnosis not present

## 2017-11-05 DIAGNOSIS — K5900 Constipation, unspecified: Secondary | ICD-10-CM | POA: Diagnosis present

## 2017-11-05 DIAGNOSIS — R05 Cough: Secondary | ICD-10-CM | POA: Diagnosis not present

## 2017-11-05 DIAGNOSIS — R109 Unspecified abdominal pain: Secondary | ICD-10-CM | POA: Diagnosis not present

## 2017-11-05 DIAGNOSIS — K5732 Diverticulitis of large intestine without perforation or abscess without bleeding: Secondary | ICD-10-CM | POA: Diagnosis not present

## 2017-11-05 LAB — CBC
HEMATOCRIT: 36.4 % — AB (ref 39.0–52.0)
HEMOGLOBIN: 12.3 g/dL — AB (ref 13.0–17.0)
MCH: 29.1 pg (ref 26.0–34.0)
MCHC: 33.8 g/dL (ref 30.0–36.0)
MCV: 86.1 fL (ref 78.0–100.0)
Platelets: 196 10*3/uL (ref 150–400)
RBC: 4.23 MIL/uL (ref 4.22–5.81)
RDW: 13 % (ref 11.5–15.5)
WBC: 10.6 10*3/uL — AB (ref 4.0–10.5)

## 2017-11-05 LAB — BASIC METABOLIC PANEL
ANION GAP: 8 (ref 5–15)
BUN: 14 mg/dL (ref 6–20)
CO2: 26 mmol/L (ref 22–32)
Calcium: 8.4 mg/dL — ABNORMAL LOW (ref 8.9–10.3)
Chloride: 101 mmol/L (ref 101–111)
Creatinine, Ser: 0.99 mg/dL (ref 0.61–1.24)
Glucose, Bld: 119 mg/dL — ABNORMAL HIGH (ref 65–99)
Potassium: 3.7 mmol/L (ref 3.5–5.1)
SODIUM: 135 mmol/L (ref 135–145)

## 2017-11-05 MED ORDER — SODIUM CHLORIDE 0.9 % IV SOLN
INTRAVENOUS | Status: DC
  Administered 2017-11-05 – 2017-11-07 (×3): via INTRAVENOUS

## 2017-11-05 MED ORDER — MORPHINE SULFATE (PF) 4 MG/ML IV SOLN
4.0000 mg | Freq: Once | INTRAVENOUS | Status: AC
  Administered 2017-11-05: 4 mg via INTRAVENOUS
  Filled 2017-11-05: qty 1

## 2017-11-05 MED ORDER — MORPHINE SULFATE (PF) 2 MG/ML IV SOLN
2.0000 mg | INTRAVENOUS | Status: DC | PRN
  Administered 2017-11-05: 2 mg via INTRAVENOUS
  Filled 2017-11-05: qty 1

## 2017-11-05 MED ORDER — ONDANSETRON HCL 4 MG/2ML IJ SOLN: 4.0000 mg | Freq: Four times a day (QID) | INTRAMUSCULAR | Status: DC | PRN

## 2017-11-05 MED ORDER — ACETAMINOPHEN 325 MG PO TABS
650.0000 mg | ORAL_TABLET | Freq: Four times a day (QID) | ORAL | Status: DC | PRN
  Administered 2017-11-06: 650 mg via ORAL
  Filled 2017-11-05 (×2): qty 2

## 2017-11-05 MED ORDER — PIPERACILLIN-TAZOBACTAM 3.375 G IVPB 30 MIN
3.3750 g | Freq: Once | INTRAVENOUS | Status: AC
  Administered 2017-11-05: 3.375 g via INTRAVENOUS
  Filled 2017-11-05: qty 50

## 2017-11-05 MED ORDER — MORPHINE SULFATE (PF) 4 MG/ML IV SOLN
2.0000 mg | INTRAVENOUS | Status: DC | PRN
  Administered 2017-11-05 – 2017-11-06 (×7): 2 mg via INTRAVENOUS
  Filled 2017-11-05 (×7): qty 1

## 2017-11-05 MED ORDER — ALBUTEROL SULFATE (2.5 MG/3ML) 0.083% IN NEBU: 2.5000 mg | INHALATION_SOLUTION | Freq: Four times a day (QID) | RESPIRATORY_TRACT | Status: DC | PRN

## 2017-11-05 MED ORDER — ACETAMINOPHEN 650 MG RE SUPP: 650.0000 mg | Freq: Four times a day (QID) | RECTAL | Status: DC | PRN

## 2017-11-05 MED ORDER — PIPERACILLIN-TAZOBACTAM 3.375 G IVPB
3.3750 g | Freq: Three times a day (TID) | INTRAVENOUS | Status: DC
  Administered 2017-11-05 – 2017-11-09 (×13): 3.375 g via INTRAVENOUS
  Filled 2017-11-05 (×16): qty 50

## 2017-11-05 MED ORDER — ONDANSETRON HCL 4 MG PO TABS: 4.0000 mg | ORAL_TABLET | Freq: Four times a day (QID) | ORAL | Status: DC | PRN

## 2017-11-05 MED ORDER — ENOXAPARIN SODIUM 40 MG/0.4ML ~~LOC~~ SOLN
40.0000 mg | SUBCUTANEOUS | Status: DC
  Administered 2017-11-05 – 2017-11-08 (×4): 40 mg via SUBCUTANEOUS
  Filled 2017-11-05 (×5): qty 0.4

## 2017-11-05 NOTE — ED Notes (Signed)
Family at bedside. 

## 2017-11-05 NOTE — H&P (Signed)
History and Physical    Jeffrey Nolan WFU:[id number] DOB: Feb 27, 1961 DOA: 11/04/2017  Referring MD/NP/PA: Dr. Stark Jock PCP: Patient, No Pcp Per  Patient coming from: home  Chief Complaint: Abdominal pain  I have personally briefly reviewed patient's old medical records in [place]   HPI: Jeffrey Nolan is a 57 y.o. male with medical history significant of adenocarcinoma of the rectum receiving radiation and chemotherapy; who presents with 2-3 days of abdominal pain.  Patient reports having generalized pain across the abdomen and complaints of abdominal swelling.  Since symptoms started he reported having urinary frequency with a burning-like abdominal pain and initially urinating.  Notes abdominal pain worse with any standing or movement.  He states that he has had 3-4 loose stools per day.  Associated symptoms of a intermittently productive cough for the last few weeks, nausea, and vomiting( reports as chronic)  However, he became more concerned last night when he developed a fever up to 100.6 F which prompted him to come to the emergency room.  Denies having any shortness of breath, blood in stool, or headache.  Prior to onset of symptoms last week patient noted being started on antibiotics for upper respiratory infection after being seen at our urgent care clinic, but these medications were discontinued after taking 1 dose by Dr. Benay Spice.  He reports that he scheduled to continue his chemotherapy and radiation treatment starting tomorrow.  He is followed by Dr. Benay Spice of oncology and Dr. Lisbeth Renshaw of radiation oncology.  ED Course: Upon admission into the emergency department patient was seen to be afebrile, pulse 83-107, blood pressure 128/70-176/79, and O2 saturation maintained on room air.  Labs revealed WBC 11.3, and lactic acid 0.99.  CT scan of the abdomen shows signs of sigmoid diverticulitis with a focal perforation.  Patient was started on empiric antibiotics of Zosyn and given  morphine for pain. [facility] called to admit.  General surgery was consulted.  Review of Systems  Constitutional: Positive for fever. Negative for malaise/fatigue.  HENT: Positive for congestion. Negative for ear pain.   Eyes: Negative for pain and discharge.  Respiratory: Positive for cough and sputum production. Negative for shortness of breath.   Cardiovascular: Negative for chest pain and leg swelling.  Gastrointestinal: Positive for abdominal pain, diarrhea and nausea.  Genitourinary: Negative for dysuria and hematuria.  Musculoskeletal: Negative for falls and neck pain.  Skin: Negative for itching and rash.  Neurological: Negative for focal weakness, loss of consciousness and headaches.  Psychiatric/Behavioral: Negative for substance abuse.    Past Medical History:  Diagnosis Date  . Cancer ([place])    Sigmoid (arising in polyp) and Rectum    No past surgical history on file.   reports that he has quit smoking. He has quit using smokeless tobacco. He reports that he drinks alcohol. He reports that he does not use drugs.  No Known Allergies  Family History  Problem Relation Age of Onset  . Bladder Cancer Father   . Lung cancer Father   . Colon cancer Neg Hx   . Esophageal cancer Neg Hx   . Pancreatic cancer Neg Hx   . Rectal cancer Neg Hx   . Stomach cancer Neg Hx     Prior to Admission medications   Medication Sig Start Date End Date Taking? Authorizing Provider  capecitabine (XELODA) 500 MG tablet Take 4 tablets (2,000 mg total) by mouth 2 (two) times daily after a meal. Take on days of radiation Mon-Fri only. 10/22/17  Yes Sherrill,  Izola Price, MD  prochlorperazine (COMPAZINE) 10 MG tablet Take 1 tablet (10 mg total) by mouth every 6 (six) hours as needed for nausea or vomiting. 10/29/17  Yes Ladell Pier, MD  sildenafil (REVATIO) 20 MG tablet TAKE 3 TABLETS BY MOUTH AS DIRECTED 06/30/17   [provider]    Physical Exam:  Constitutional: Middle-aged male who  appears to be in [place]:   11/05/17 0230 11/05/17 0300 11/05/17 0330 11/05/17 0400  BP: (!) 166/88 (!) 168/77 (!) 176/79 128/70  Pulse: 97 95 92 88  Resp: 16 17 17 19   Temp:      TempSrc:      SpO2: 100%  100% 99%   Eyes: PERRL, lids and conjunctivae normal ENMT: Mucous membranes are dry. Posterior pharynx clear of any exudate or lesions. Neck: normal, supple, no masses, no thyromegaly Respiratory: clear to auscultation bilaterally, no wheezing, no crackles. Normal respiratory effort. No accessory muscle use.  Cardiovascular: Regular rate and rhythm, no murmurs / rubs / gallops. No extremity edema. 2+ pedal pulses. No carotid bruits.  Abdomen: Mild tenderness to palpation across the periumbilical region of the abdomen, no masses palpated. No hepatosplenomegaly. Bowel sounds positive.  Musculoskeletal: no clubbing / cyanosis. No joint deformity upper and lower extremities. Good ROM, no contractures. Normal muscle tone.  Skin: no rashes, lesions, ulcers. No induration Neurologic: CN 2-12 grossly intact. Sensation intact, DTR normal. Strength 5/5 in all 4.  Psychiatric: Normal judgment and insight. Alert and oriented x 3. Normal mood.     Labs on Admission: I have personally reviewed following labs and imaging studies  CBC: Recent Labs  Lab 11/04/17 2227  WBC 11.3*  NEUTROABS 9.4*  HGB 13.0  HCT 39.2  MCV 86.3  PLT 629   Basic Metabolic Panel: Recent Labs  Lab 11/04/17 2227  NA 137  K 3.8  CL 103  CO2 26  GLUCOSE 106*  BUN 19  CREATININE 1.01  CALCIUM 8.7*   GFR: Estimated Creatinine Clearance: 104.9 mL/min (by C-G formula based on SCr of 1.01 mg/dL). Liver Function Tests: Recent Labs  Lab 11/04/17 2227  AST 21  ALT 24  ALKPHOS 65  BILITOT 1.0  PROT 6.8  ALBUMIN 3.7   Recent Labs  Lab 11/04/17 2227  LIPASE 22   No results for input(s): AMMONIA in the last 168 hours. Coagulation Profile: No results for input(s): INR, PROTIME in the last 168  hours. Cardiac Enzymes: No results for input(s): CKTOTAL, CKMB, CKMBINDEX, TROPONINI in the last 168 hours. BNP (last 3 results) No results for input(s): PROBNP in the last 8760 hours. HbA1C: No results for input(s): HGBA1C in the last 72 hours. CBG: No results for input(s): GLUCAP in the last 168 hours. Lipid Profile: No results for input(s): CHOL, HDL, LDLCALC, TRIG, CHOLHDL, LDLDIRECT in the last 72 hours. Thyroid Function Tests: No results for input(s): TSH, T4TOTAL, FREET4, T3FREE, THYROIDAB in the last 72 hours. Anemia Panel: No results for input(s): VITAMINB12, FOLATE, FERRITIN, TIBC, IRON, RETICCTPCT in the last 72 hours. Urine analysis:    Component Value Date/Time   COLORURINE YELLOW 11/04/2017 2112   APPEARANCEUR CLEAR 11/04/2017 2112   LABSPEC 1.013 11/04/2017 2112   PHURINE 6.0 11/04/2017 2112   GLUCOSEU NEGATIVE 11/04/2017 2112   HGBUR SMALL (A) 11/04/2017 2112   [facility] NEGATIVE 11/04/2017 2112   [place] NEGATIVE 11/04/2017 2112   PROTEINUR NEGATIVE 11/04/2017 2112   NITRITE NEGATIVE 11/04/2017 2112   LEUKOCYTESUR NEGATIVE 11/04/2017 2112   Sepsis Labs: No results  found for this or any previous visit (from the past 240 hour(s)).   Radiological Exams on Admission: Dg Chest 2 View  Result Date: 11/04/2017 CLINICAL DATA:  57 year old male with cough and fever. EXAM: CHEST  2 VIEW COMPARISON:  Chest CT dated 10/08/2017 FINDINGS: The heart size and mediastinal contours are within normal limits. Both lungs are clear. The visualized skeletal structures are unremarkable. IMPRESSION: No active cardiopulmonary disease. Electronically Signed   By: Anner Crete M.D.   On: 11/04/2017 22:17   Ct Abdomen Pelvis W Contrast  Result Date: 11/05/2017 CLINICAL DATA:  57 year old male with abdominal pain. History of rectal adenocarcinoma. EXAM: CT ABDOMEN AND PELVIS WITH CONTRAST TECHNIQUE: Multidetector CT imaging of the abdomen and pelvis was performed using the standard  protocol following bolus administration of intravenous contrast. CONTRAST:  152mL ISOVUE-300 IOPAMIDOL (ISOVUE-300) INJECTION 61% COMPARISON:  Pelvic MRI dated 10/20/2017 and abdominal CT dated 10/08/2017 FINDINGS: Lower chest: The visualized lung bases are clear. There is a small calcified granuloma in the right posterior costophrenic angle. No intra-abdominal free air or free fluid. Hepatobiliary: Probable mild fatty infiltration of the liver. Small scattered hepatic hypodense lesions, not characterized on this CT corresponding to the cysts noted the prior MRI. These are stable compared to the CT of 10/08/2017. No intrahepatic biliary ductal dilatation. The gallbladder is unremarkable. Pancreas: Unremarkable. No pancreatic ductal dilatation or surrounding inflammatory changes. Spleen: Normal in size without focal abnormality. Adrenals/Urinary Tract: The adrenal glands are unremarkable. Left renal cysts better seen on the prior MRI. The kidneys are otherwise unremarkable. There is symmetric enhancement and excretion of contrast by both kidneys. The visualized ureters and urinary bladder appear unremarkable. Stomach/Bowel: There is sigmoid diverticulosis without active inflammatory changes compatible with acute diverticulitis. Focally contained extraluminal air adjacent to the sigmoid colon. No diverticular abscess. No bowel obstruction. The known rectal mass is not evaluated on today's exam. No perirectal adenopathy. Normal appendix. Vascular/Lymphatic: Mild aortoiliac atherosclerotic disease. The abdominal aorta and IVC are otherwise unremarkable. No portal venous gas. There is no adenopathy. Reproductive: The prostate and seminal vesicles are grossly unremarkable. Other: Small fat containing umbilical hernia.  No fluid collection. Musculoskeletal: Grade 1 L4-L5 anterolisthesis. No acute osseous pathology. IMPRESSION: 1. Sigmoid diverticulitis with focally contained diverticular perforation. No abscess. 2. No  bowel obstruction.  Normal appendix. 3. Small hepatic and left renal cysts. 4.  Aortic Atherosclerosis (ICD10-I70.0). These results were called by telephone at the time of interpretation on 11/05/2017 at 12:17 am to Dr. Sherwood Gambler , who verbally acknowledged these results. Electronically Signed   By: Anner Crete M.D.   On: 11/05/2017 00:19    EKG: Independently reviewed.  Normal sinus rhythm  Assessment/Plan Diverticulitis with perforated: Acute.  Presents with 2-3 days of abdominal pain and diarrhea.  CT scan showing signs of acute sigmoid diverticulitis with signs of perforation.  Patient was started on empiric antibiotics of Zosyn.  General surgery was consulted. - Admit to a med-surg bed - NPO - Monitor intake and output - Follow-up blood culture  - Continue empiric antibiotics of Zosyn - IV fluids of normal saline at 100 mL/h tolerated - General surgery Dr. Excell Seltzer consulted, and they will see in a.m.  Leukocytosis: WBC elevated at 11.3.  Suspect secondary to above.  - Recheck CBC in a.m.  Rectal adenocarcinoma: Patient on oral medications Xeloda and followed by Dr. Benay Spice of oncology.  Also currently receiving radiation therapy followed by Dr. Lisbeth Renshaw of radiation oncology. - Restart oral chemotherapy medications when medically  appropriate - Dr. Benay Spice added to patient's team list  - Notify Dr. Lisbeth Renshaw that the patient is here in the hospital  DVT prophylaxis: lovenox   Code Status:Full  Family Communication: Discussed plan of care with the patient and family present at bedside Disposition Plan: TBD  Consults called: Surgery  Admission status: Inpatient  Norval Morton MD [facility] Pager [phone]   If 7PM-7AM, please contact night-coverage [email or web address] Password [facility]  11/05/2017, 4:35 AM

## 2017-11-05 NOTE — ED Notes (Signed)
Bed: KG88 Expected date:  Expected time:  Means of arrival:  Comments: 14

## 2017-11-05 NOTE — ED Notes (Signed)
Gave report to danielle RN on 5

## 2017-11-05 NOTE — ED Notes (Signed)
Pt switched from stretcher to hospital bed, family given recliner.

## 2017-11-05 NOTE — Progress Notes (Signed)
Pharmacy Antibiotic Note  Jeffrey Nolan is a 57 y.o. male admitted on 11/04/2017 with Intra-abdominal infection.  Pharmacy has been consulted for zosyn dosing.  Plan: Zosyn 3.375g IV q8h (4 hour infusion).     Temp (24hrs), Avg:99 F (37.2 C), Min:98.5 F (36.9 C), Max:99.4 F (37.4 C)  Recent Labs  Lab 11/04/17 2227 11/04/17 2231  WBC 11.3*  --   CREATININE 1.01  --   LATICACIDVEN  --  0.99    Estimated Creatinine Clearance: 104.9 mL/min (by C-G formula based on SCr of 1.01 mg/dL).    No Known Allergies  Antimicrobials this admission: Zosyn 11/04/2017 >>   Dose adjustments this admission: -  Microbiology results: pending  Thank you for allowing pharmacy to be a part of this patient's care.  Nani Skillern Crowford 11/05/2017 5:05 AM

## 2017-11-05 NOTE — Progress Notes (Signed)
Patient approved for wife to be in room during admission process questioning

## 2017-11-05 NOTE — Consult Note (Signed)
[facility] Surgery Consult Note  Jeffrey Nolan 07-31-1961  [id number].    Requesting MD: Fuller Plan Chief Complaint/Reason for Consult: diverticulitis  HPI:  Jeffrey Nolan is a 57yo male PMH rectal adenocarcinoma currently on chemo (Xeloda) and radiation followed by Dr. Lisbeth Renshaw and Dr. Benay Spice, who presented to [facility] last night with worsening lower abdominal pain. States that the pain started on Sunday and it has gradually gotten worse. Associated with abdominal bloating, diarrhea, and low grade fever. Denies nausea, vomiting, chills.  States that he has never had pain like this before. He had a colonoscopy 10/03/17 which showed tumor in mid-rectum as well as mild diverticulosis in the sigmoid colon and in the ascending colon.  In the ED CT scan showed sigmoid diverticulitis with focally contained diverticular perforation, no abscess. WBC 11.3, lactic acid 0.99, TMAX 99.4. He was started on zosyn and is being admitted to the hospital by internal medicine. General surgery asked to consult.  PMH significant for rectal adenocarcinoma Abdominal surgical history: none Anticoagulants: none Nonsmoker Employment: security at [employer]: Review of Systems  Constitutional: Positive for fever.  HENT: Negative.   Eyes: Negative.   Respiratory: Positive for cough.   Cardiovascular: Negative.   Gastrointestinal: Positive for abdominal pain and diarrhea. Negative for nausea and vomiting.  Genitourinary: Negative.   Musculoskeletal: Negative.   Skin: Negative.   Neurological: Negative.    All systems reviewed and otherwise negative except for as above  Family History  Problem Relation Age of Onset  . Bladder Cancer Father   . Lung cancer Father   . Colon cancer Neg Hx   . Esophageal cancer Neg Hx   . Pancreatic cancer Neg Hx   . Rectal cancer Neg Hx   . Stomach cancer Neg Hx     Past Medical History:  Diagnosis Date  . Cancer ([place])    Sigmoid (arising in polyp) and Rectum     No past surgical history on file.  Social History:  reports that he has quit smoking. He has quit using smokeless tobacco. He reports that he drinks alcohol. He reports that he does not use drugs.  Allergies: No Known Allergies   (Not in a hospital admission)  Prior to Admission medications   Medication Sig Start Date End Date Taking? Authorizing Provider  capecitabine (XELODA) 500 MG tablet Take 4 tablets (2,000 mg total) by mouth 2 (two) times daily after a meal. Take on days of radiation Mon-Fri only. 10/22/17  Yes Ladell Pier, MD  prochlorperazine (COMPAZINE) 10 MG tablet Take 1 tablet (10 mg total) by mouth every 6 (six) hours as needed for nausea or vomiting. 10/29/17  Yes Ladell Pier, MD  sildenafil (REVATIO) 20 MG tablet TAKE 3 TABLETS BY MOUTH AS DIRECTED 06/30/17   [provider]    Blood pressure (!) 152/81, pulse 88, temperature 99.1 F (37.3 C), temperature source Oral, resp. rate 19, SpO2 97 %. Physical Exam: General: pleasant, WD/WN white male who is laying in bed in NAD HEENT: head is normocephalic, atraumatic.  Sclera are noninjected.  Pupils equal and round.  Ears and nose without any masses or lesions.  Mouth is pink and moist. Dentition fair Heart: regular, rate, and rhythm.  No obvious murmurs, gallops, or rubs noted.  Palpable pedal pulses bilaterally Lungs: CTAB, no wheezes, rhonchi, or rales noted.  Respiratory effort nonlabored Abd: soft, mild distension, +BS, no masses, hernias, or organomegaly. TTP LLQ, suprapubic, and mild RLQ tenderness without rebound or guarding  MS: all 4 extremities are symmetrical with no cyanosis, clubbing, or edema. Skin: warm and dry with no masses, lesions, or rashes Psych: A&Ox3 with an appropriate affect. Neuro: cranial nerves grossly intact, extremity CSM intact bilaterally, normal speech  Results for orders placed or performed during the hospital encounter of 11/04/17 (from the past 48 hour(s))  Urinalysis,  Routine w reflex microscopic- may I&O cath if menses     Status: Abnormal   Collection Time: 11/04/17  9:12 PM  Result Value Ref Range   Color, Urine YELLOW YELLOW   APPearance CLEAR CLEAR   Specific Gravity, Urine 1.013 1.005 - 1.030   pH 6.0 5.0 - 8.0   Glucose, UA NEGATIVE NEGATIVE mg/dL   Hgb urine dipstick SMALL (A) NEGATIVE   Bilirubin Urine NEGATIVE NEGATIVE   Ketones, ur NEGATIVE NEGATIVE mg/dL   Protein, ur NEGATIVE NEGATIVE mg/dL   Nitrite NEGATIVE NEGATIVE   Leukocytes, UA NEGATIVE NEGATIVE   RBC / HPF 0-5 0 - 5 RBC/hpf   WBC, UA NONE SEEN 0 - 5 WBC/hpf   Bacteria, UA NONE SEEN NONE SEEN   Squamous Epithelial / LPF NONE SEEN NONE SEEN   Mucus PRESENT   Comprehensive metabolic panel     Status: Abnormal   Collection Time: 11/04/17 10:27 PM  Result Value Ref Range   Sodium 137 135 - 145 mmol/L   Potassium 3.8 3.5 - 5.1 mmol/L   Chloride 103 101 - 111 mmol/L   CO2 26 22 - 32 mmol/L   Glucose, Bld 106 (H) 65 - 99 mg/dL   BUN 19 6 - 20 mg/dL   Creatinine, Ser 1.01 0.61 - 1.24 mg/dL   Calcium 8.7 (L) 8.9 - 10.3 mg/dL   Total Protein 6.8 6.5 - 8.1 g/dL   Albumin 3.7 3.5 - 5.0 g/dL   AST 21 15 - 41 U/L   ALT 24 17 - 63 U/L   Alkaline Phosphatase 65 38 - 126 U/L   Total Bilirubin 1.0 0.3 - 1.2 mg/dL   GFR calc non Af Amer >60 >60 mL/min   GFR calc Af Amer >60 >60 mL/min    Comment: (NOTE) The eGFR has been calculated using the CKD EPI equation. This calculation has not been validated in all clinical situations. eGFR's persistently <60 mL/min signify possible Chronic Kidney Disease.    Anion gap 8 5 - 15  Lipase, blood     Status: None   Collection Time: 11/04/17 10:27 PM  Result Value Ref Range   Lipase 22 11 - 51 U/L  CBC with Differential     Status: Abnormal   Collection Time: 11/04/17 10:27 PM  Result Value Ref Range   WBC 11.3 (H) 4.0 - 10.5 K/uL   RBC 4.54 4.22 - 5.81 MIL/uL   Hemoglobin 13.0 13.0 - 17.0 g/dL   HCT 39.2 39.0 - 52.0 %   MCV 86.3 78.0 -  100.0 fL   MCH 28.6 26.0 - 34.0 pg   MCHC 33.2 30.0 - 36.0 g/dL   RDW 12.8 11.5 - 15.5 %   Platelets 229 150 - 400 K/uL   Neutrophils Relative % 84 %   Neutro Abs 9.4 (H) 1.7 - 7.7 K/uL   Lymphocytes Relative 7 %   Lymphs Abs 0.8 0.7 - 4.0 K/uL   Monocytes Relative 9 %   Monocytes Absolute 1.0 0.1 - 1.0 K/uL   Eosinophils Relative 0 %   Eosinophils Absolute 0.0 0.0 - 0.7 K/uL   Basophils Relative 0 %  Basophils Absolute 0.0 0.0 - 0.1 K/uL  I-Stat CG4 Lactic Acid, ED     Status: None   Collection Time: 11/04/17 10:31 PM  Result Value Ref Range   Lactic Acid, Venous 0.99 0.5 - 1.9 mmol/L  Influenza panel by PCR (type A & B)     Status: None   Collection Time: 11/04/17 10:50 PM  Result Value Ref Range   Influenza A By PCR NEGATIVE NEGATIVE   Influenza B By PCR NEGATIVE NEGATIVE    Comment: (NOTE) The Xpert Xpress Flu assay is intended as an aid in the diagnosis of  influenza and should not be used as a sole basis for treatment.  This  assay is FDA approved for nasopharyngeal swab specimens only. Nasal  washings and aspirates are unacceptable for Xpert Xpress Flu testing.   Basic metabolic panel     Status: Abnormal   Collection Time: 11/05/17  5:22 AM  Result Value Ref Range   Sodium 135 135 - 145 mmol/L   Potassium 3.7 3.5 - 5.1 mmol/L   Chloride 101 101 - 111 mmol/L   CO2 26 22 - 32 mmol/L   Glucose, Bld 119 (H) 65 - 99 mg/dL   BUN 14 6 - 20 mg/dL   Creatinine, Ser 0.99 0.61 - 1.24 mg/dL   Calcium 8.4 (L) 8.9 - 10.3 mg/dL   GFR calc non Af Amer >60 >60 mL/min   GFR calc Af Amer >60 >60 mL/min    Comment: (NOTE) The eGFR has been calculated using the CKD EPI equation. This calculation has not been validated in all clinical situations. eGFR's persistently <60 mL/min signify possible Chronic Kidney Disease.    Anion gap 8 5 - 15  CBC     Status: Abnormal   Collection Time: 11/05/17  5:22 AM  Result Value Ref Range   WBC 10.6 (H) 4.0 - 10.5 K/uL   RBC 4.23 4.22 -  5.81 MIL/uL   Hemoglobin 12.3 (L) 13.0 - 17.0 g/dL   HCT 36.4 (L) 39.0 - 52.0 %   MCV 86.1 78.0 - 100.0 fL   MCH 29.1 26.0 - 34.0 pg   MCHC 33.8 30.0 - 36.0 g/dL   RDW 13.0 11.5 - 15.5 %   Platelets 196 150 - 400 K/uL   Dg Chest 2 View  Result Date: 11/04/2017 CLINICAL DATA:  57 year old male with cough and fever. EXAM: CHEST  2 VIEW COMPARISON:  Chest CT dated 10/08/2017 FINDINGS: The heart size and mediastinal contours are within normal limits. Both lungs are clear. The visualized skeletal structures are unremarkable. IMPRESSION: No active cardiopulmonary disease. Electronically Signed   By: Anner Crete M.D.   On: 11/04/2017 22:17   Ct Abdomen Pelvis W Contrast  Result Date: 11/05/2017 CLINICAL DATA:  57 year old male with abdominal pain. History of rectal adenocarcinoma. EXAM: CT ABDOMEN AND PELVIS WITH CONTRAST TECHNIQUE: Multidetector CT imaging of the abdomen and pelvis was performed using the standard protocol following bolus administration of intravenous contrast. CONTRAST:  125m ISOVUE-300 IOPAMIDOL (ISOVUE-300) INJECTION 61% COMPARISON:  Pelvic MRI dated 10/20/2017 and abdominal CT dated 10/08/2017 FINDINGS: Lower chest: The visualized lung bases are clear. There is a small calcified granuloma in the right posterior costophrenic angle. No intra-abdominal free air or free fluid. Hepatobiliary: Probable mild fatty infiltration of the liver. Small scattered hepatic hypodense lesions, not characterized on this CT corresponding to the cysts noted the prior MRI. These are stable compared to the CT of 10/08/2017. No intrahepatic biliary ductal dilatation. The  gallbladder is unremarkable. Pancreas: Unremarkable. No pancreatic ductal dilatation or surrounding inflammatory changes. Spleen: Normal in size without focal abnormality. Adrenals/Urinary Tract: The adrenal glands are unremarkable. Left renal cysts better seen on the prior MRI. The kidneys are otherwise unremarkable. There is symmetric  enhancement and excretion of contrast by both kidneys. The visualized ureters and urinary bladder appear unremarkable. Stomach/Bowel: There is sigmoid diverticulosis without active inflammatory changes compatible with acute diverticulitis. Focally contained extraluminal air adjacent to the sigmoid colon. No diverticular abscess. No bowel obstruction. The known rectal mass is not evaluated on today's exam. No perirectal adenopathy. Normal appendix. Vascular/Lymphatic: Mild aortoiliac atherosclerotic disease. The abdominal aorta and IVC are otherwise unremarkable. No portal venous gas. There is no adenopathy. Reproductive: The prostate and seminal vesicles are grossly unremarkable. Other: Small fat containing umbilical hernia.  No fluid collection. Musculoskeletal: Grade 1 L4-L5 anterolisthesis. No acute osseous pathology. IMPRESSION: 1. Sigmoid diverticulitis with focally contained diverticular perforation. No abscess. 2. No bowel obstruction.  Normal appendix. 3. Small hepatic and left renal cysts. 4.  Aortic Atherosclerosis (ICD10-I70.0). These results were called by telephone at the time of interpretation on 11/05/2017 at 12:17 am to Dr. Sherwood Gambler , who verbally acknowledged these results. Electronically Signed   By: Anner Crete M.D.   On: 11/05/2017 00:19    Anti-infectives (From admission, onward)   Start     Dose/Rate Route Frequency Ordered Stop   11/05/17 0600  piperacillin-tazobactam (ZOSYN) IVPB 3.375 g     3.375 g 12.5 mL/hr over 240 Minutes Intravenous Every 8 hours 11/05/17 0504     11/05/17 0030  piperacillin-tazobactam (ZOSYN) IVPB 3.375 g     3.375 g 100 mL/hr over 30 Minutes Intravenous  Once 11/05/17 0023 11/05/17 0109       Assessment/Plan Rectal adenocarcinoma - currently on chemo (Xeloda) and radiation followed by Dr. Lisbeth Renshaw and Dr. Benay Spice, planning to see Dr. Drue Flirt at [facility] for surgery  Diverticulitis with perforation - no prior h/o abdominal surgery, last  colonoscopy 10/03/17 showed tumor in mid-rectum as well as mild diverticulosis in the sigmoid colon and in the ascending colon - 3 days worsening lower abdominal pain - CT scan show sigmoid diverticulitis with focally contained diverticular perforation, no abscess - WBC 11.3, lactic acid 0.99, TMAX 99.4. He  ID - zosyn 1/16>> VTE - SCDs, lovenox FEN - IVF, NPO except ice chips Foley - none Follow up - TBD  Plan - This is Jeffrey Nolan first episode of diverticulitis. Agree with IV zosyn and bowel rest. Hopefully he will improve without surgery this admission. Will continue to follow.   Wellington Hampshire, [facility] Surgery 11/05/2017, 7:53 AM Pager: [phone] Consults: [phone] Mon-Fri 7:00 am-4:30 pm Sat-Sun 7:00 am-11:30 am

## 2017-11-05 NOTE — Progress Notes (Signed)
Patient ID: Jeffrey Nolan, male   DOB: June 06, 1961, 57 y.o.   MRN: [id number] Patient was admitted early this morning for abdominal pain secondary to diverticulitis with contained perforation.  General surgery has been consulted.  He is currently on intravenous Zosyn.  Patient was seen and examined at bedside and plan of care discussed with him.  His medical records and his H&P from this morning was reviewed by myself.  I briefly spoke to Dr. Sherill/Oncology who stated that he will review the patient's records and will see him tomorrow if needed.  Continue antibiotics.  Repeat a.m. labs.

## 2017-11-06 ENCOUNTER — Ambulatory Visit: Admission: RE | Admit: 2017-11-06 | Payer: Federal, State, Local not specified - PPO | Source: Ambulatory Visit

## 2017-11-06 ENCOUNTER — Encounter: Payer: Self-pay | Admitting: *Deleted

## 2017-11-06 DIAGNOSIS — D72829 Elevated white blood cell count, unspecified: Secondary | ICD-10-CM

## 2017-11-06 DIAGNOSIS — C20 Malignant neoplasm of rectum: Secondary | ICD-10-CM

## 2017-11-06 LAB — BASIC METABOLIC PANEL
Anion gap: 7 (ref 5–15)
BUN: 16 mg/dL (ref 6–20)
CHLORIDE: 103 mmol/L (ref 101–111)
CO2: 28 mmol/L (ref 22–32)
CREATININE: 0.94 mg/dL (ref 0.61–1.24)
Calcium: 8.6 mg/dL — ABNORMAL LOW (ref 8.9–10.3)
GFR calc Af Amer: >60 mL/min (ref >60)
GLUCOSE: 100 mg/dL — AB (ref 65–99)
POTASSIUM: 3.8 mmol/L (ref 3.5–5.1)
SODIUM: 138 mmol/L (ref 135–145)

## 2017-11-06 LAB — CBC WITH DIFFERENTIAL/PLATELET
BASOS ABS: 0 10*3/uL (ref 0.0–0.1)
BASOS PCT: 0 %
Eosinophils Absolute: 0.1 10*3/uL (ref 0.0–0.7)
Eosinophils Relative: 1 %
HEMATOCRIT: 36.3 % — AB (ref 39.0–52.0)
HEMOGLOBIN: 12.1 g/dL — AB (ref 13.0–17.0)
LYMPHS PCT: 10 %
Lymphs Abs: 0.8 10*3/uL (ref 0.7–4.0)
MCH: 28.9 pg (ref 26.0–34.0)
MCHC: 33.3 g/dL (ref 30.0–36.0)
MCV: 86.6 fL (ref 78.0–100.0)
Monocytes Absolute: 0.7 10*3/uL (ref 0.1–1.0)
Monocytes Relative: 9 %
NEUTROS PCT: 80 %
Neutro Abs: 6.6 10*3/uL (ref 1.7–7.7)
Platelets: 224 10*3/uL (ref 150–400)
RBC: 4.19 MIL/uL — AB (ref 4.22–5.81)
RDW: 12.9 % (ref 11.5–15.5)
WBC: 8.2 10*3/uL (ref 4.0–10.5)

## 2017-11-06 LAB — MAGNESIUM: MAGNESIUM: 2 mg/dL (ref 1.7–2.4)

## 2017-11-06 MED ORDER — DIPHENHYDRAMINE HCL 25 MG PO CAPS
25.0000 mg | ORAL_CAPSULE | Freq: Four times a day (QID) | ORAL | Status: DC | PRN
  Administered 2017-11-06 – 2017-11-08 (×5): 25 mg via ORAL
  Filled 2017-11-06 (×5): qty 1

## 2017-11-06 MED ORDER — OXYCODONE-ACETAMINOPHEN 5-325 MG PO TABS
1.0000 | ORAL_TABLET | Freq: Four times a day (QID) | ORAL | Status: DC | PRN
  Administered 2017-11-06: 2 via ORAL
  Administered 2017-11-06: 1 via ORAL
  Administered 2017-11-07 (×3): 2 via ORAL
  Administered 2017-11-08: 1 via ORAL
  Administered 2017-11-08: 2 via ORAL
  Administered 2017-11-09: 1 via ORAL
  Filled 2017-11-06: qty 1
  Filled 2017-11-06: qty 2
  Filled 2017-11-06: qty 1
  Filled 2017-11-06 (×2): qty 2
  Filled 2017-11-06: qty 1
  Filled 2017-11-06 (×2): qty 2

## 2017-11-06 NOTE — Progress Notes (Signed)
Patient developed a rash all over back. Small red bumps, some with blister like appearance. Patient states that it itches. Paged MD responds.

## 2017-11-06 NOTE — Progress Notes (Signed)
0830 Spoke with the nurse [place] caring for [employer] reports he is feeling better and having less abdominal pain this morning and should be okay to have his radiation treatment this afternoon. Dr. Benay Spice saw him this morning no note in the chart yet.  Had a CBC w diff  and Bmet drawn today results were within normal range.  I told Danille I would let the radiation therapist know Jeffrey Nolan would come down today for his 1455 treatment today; if any thing changes to call me back at [phone] and to give pain medication if needed.

## 2017-11-06 NOTE — Progress Notes (Signed)
56 Spoke with Dr. Lisbeth Renshaw review labs results are okay for treatment today.  [place] [facility] called made aware patient can receive his treatment today per Dr. Lisbeth Renshaw.

## 2017-11-06 NOTE — Progress Notes (Signed)
[facility] Progress Note     Subjective: CC-  Feeling better today than yesterday. Lower abdominal pain still present and requiring IV pain medication, but improved from yesterday. States that abdominal distension is improving. Reports mild intermittent nausea, no emesis.   Objective: Vital signs in last 24 hours: Temp:  [98.5 F (36.9 C)-99.4 F (37.4 C)] 99.4 F (37.4 C) (01/17 0628) Pulse Rate:  [77-81] 79 (01/17 0628) Resp:  [16-20] 16 (01/17 0628) BP: (105-126)/(60-77) 105/60 (01/17 0628) SpO2:  [96 %-98 %] 96 % (01/17 0628) Last BM Date: 11/04/17  Intake/Output from previous day: 01/16 0701 - 01/17 0700 In: 2236.7 [I.V.:2086.7; IV Piggyback:150] Out: -  Intake/Output this shift: No intake/output data recorded.  PE: Gen:  Alert, NAD, pleasant HEENT: EOM's intact, pupils equal and round Card:  RRR, no M/G/R heard Pulm:  CTAB, no W/R/R, effort normal Abd: Soft, mild distension, +BS, no HSM, no hernia. Mild LLQ and suprapubic TTP without rebound or guarding Ext:  No erythema, edema, or tenderness BUE/BLE  Psych: A&Ox3  Skin: no rashes noted, warm and dry  Lab Results:  Recent Labs    11/05/17 0522 11/06/17 0627  WBC 10.6* 8.2  HGB 12.3* 12.1*  HCT 36.4* 36.3*  PLT 196 224   BMET Recent Labs    11/05/17 0522 11/06/17 0627  NA 135 138  K 3.7 3.8  CL 101 103  CO2 26 28  GLUCOSE 119* 100*  BUN 14 16  CREATININE 0.99 0.94  CALCIUM 8.4* 8.6*   PT/INR No results for input(s): LABPROT, INR in the last 72 hours. CMP     Component Value Date/Time   NA 138 11/06/2017 0627   K 3.8 11/06/2017 0627   CL 103 11/06/2017 0627   CO2 28 11/06/2017 0627   GLUCOSE 100 (H) 11/06/2017 0627   BUN 16 11/06/2017 0627   CREATININE 0.94 11/06/2017 0627   CALCIUM 8.6 (L) 11/06/2017 0627   PROT 6.8 11/04/2017 2227   ALBUMIN 3.7 11/04/2017 2227   AST 21 11/04/2017 2227   ALT 24 11/04/2017 2227   ALKPHOS 65 11/04/2017 2227   BILITOT 1.0 11/04/2017 2227    GFRNONAA >60 11/06/2017 0627   GFRAA >60 11/06/2017 0627   Lipase     Component Value Date/Time   LIPASE 22 11/04/2017 2227       Studies/Results: Dg Chest 2 View  Result Date: 11/04/2017 CLINICAL DATA:  57 year old male with cough and fever. EXAM: CHEST  2 VIEW COMPARISON:  Chest CT dated 10/08/2017 FINDINGS: The heart size and mediastinal contours are within normal limits. Both lungs are clear. The visualized skeletal structures are unremarkable. IMPRESSION: No active cardiopulmonary disease. Electronically Signed   By: Anner Crete M.D.   On: 11/04/2017 22:17   Ct Abdomen Pelvis W Contrast  Result Date: 11/05/2017 CLINICAL DATA:  57 year old male with abdominal pain. History of rectal adenocarcinoma. EXAM: CT ABDOMEN AND PELVIS WITH CONTRAST TECHNIQUE: Multidetector CT imaging of the abdomen and pelvis was performed using the standard protocol following bolus administration of intravenous contrast. CONTRAST:  155mL ISOVUE-300 IOPAMIDOL (ISOVUE-300) INJECTION 61% COMPARISON:  Pelvic MRI dated 10/20/2017 and abdominal CT dated 10/08/2017 FINDINGS: Lower chest: The visualized lung bases are clear. There is a small calcified granuloma in the right posterior costophrenic angle. No intra-abdominal free air or free fluid. Hepatobiliary: Probable mild fatty infiltration of the liver. Small scattered hepatic hypodense lesions, not characterized on this CT corresponding to the cysts noted the prior MRI. These are stable compared  to the CT of 10/08/2017. No intrahepatic biliary ductal dilatation. The gallbladder is unremarkable. Pancreas: Unremarkable. No pancreatic ductal dilatation or surrounding inflammatory changes. Spleen: Normal in size without focal abnormality. Adrenals/Urinary Tract: The adrenal glands are unremarkable. Left renal cysts better seen on the prior MRI. The kidneys are otherwise unremarkable. There is symmetric enhancement and excretion of contrast by both kidneys. The  visualized ureters and urinary bladder appear unremarkable. Stomach/Bowel: There is sigmoid diverticulosis without active inflammatory changes compatible with acute diverticulitis. Focally contained extraluminal air adjacent to the sigmoid colon. No diverticular abscess. No bowel obstruction. The known rectal mass is not evaluated on today's exam. No perirectal adenopathy. Normal appendix. Vascular/Lymphatic: Mild aortoiliac atherosclerotic disease. The abdominal aorta and IVC are otherwise unremarkable. No portal venous gas. There is no adenopathy. Reproductive: The prostate and seminal vesicles are grossly unremarkable. Other: Small fat containing umbilical hernia.  No fluid collection. Musculoskeletal: Grade 1 L4-L5 anterolisthesis. No acute osseous pathology. IMPRESSION: 1. Sigmoid diverticulitis with focally contained diverticular perforation. No abscess. 2. No bowel obstruction.  Normal appendix. 3. Small hepatic and left renal cysts. 4.  Aortic Atherosclerosis (ICD10-I70.0). These results were called by telephone at the time of interpretation on 11/05/2017 at 12:17 am to Dr. Sherwood Gambler , who verbally acknowledged these results. Electronically Signed   By: Anner Crete M.D.   On: 11/05/2017 00:19    Anti-infectives: Anti-infectives (From admission, onward)   Start     Dose/Rate Route Frequency Ordered Stop   11/05/17 0600  piperacillin-tazobactam (ZOSYN) IVPB 3.375 g     3.375 g 12.5 mL/hr over 240 Minutes Intravenous Every 8 hours 11/05/17 0504     11/05/17 0030  piperacillin-tazobactam (ZOSYN) IVPB 3.375 g     3.375 g 100 mL/hr over 30 Minutes Intravenous  Once 11/05/17 0023 11/05/17 0109       Assessment/Plan Rectal adenocarcinoma - currently on chemo (Xeloda) and radiation followed by Dr. Lisbeth Renshaw and Dr. Benay Spice, planning to see Dr. Drue Flirt at [facility] for surgery  Diverticulitis with perforation - no prior h/o abdominal surgery, last colonoscopy 10/03/17 showed tumor in mid-rectum  as well as mild diverticulosis in the sigmoid colon and in the ascending colon - CT scan show sigmoid diverticulitis with focally contained diverticular perforation, no abscess - WBC trending down 8.2, TMAX 99.4  ID - zosyn 1/16>>day#2 VTE - SCDs, lovenox FEN - IVF, ice chips/sips clears from the floor Foley - none Follow up - TBD  Plan - WBC WNL and pain improving. Ok for ice chips and sips of clear liquids from the floor. Continue IV zosyn. Encouraged ambulation today. CBC in AM.   LOS: 1 day    Wellington Hampshire , [facility] Surgery 11/06/2017, 8:30 AM Pager: [phone] Consults: [phone] Mon-Fri 7:00 am-4:30 pm Sat-Sun 7:00 am-11:30 am

## 2017-11-06 NOTE — Progress Notes (Signed)
IP PROGRESS NOTE  Subjective:   Jeffrey Nolan begin concurrent radiation and Xeloda on 10/27/2017.  No mouth sores, hand/foot pain, or diarrhea while on treatment. He developed low abdominal pain beginning 11/02/2017.  He also noted urinary frequency.  On the morning of 11/03/2017 he had a fever and an episode of diarrhea.  He had an upper respiratory infection last week.  On admission a CT of the abdomen/pelvis revealed sigmoid diverticulosis with evidence of acute diverticulitis.  Focal extraluminal air was noted adjacent to the sigmoid colon.  The rectal mass was not seen.  He was admitted and placed on bowel rest, fluids, and IV antibiotics.  He reports feeling better.  Objective: Vital signs in last 24 hours: Blood pressure 105/60, pulse 79, temperature 99.4 F (37.4 C), temperature source Oral, resp. rate 16, SpO2 96 %.  Intake/Output from previous day: 01/16 0701 - 01/17 0700 In: 2236.7 [I.V.:2086.7; IV Piggyback:150] Out: -   Physical Exam:  HEENT: No thrush or ulcers Lungs: Clear bilaterally Cardiac: Regular rate and rhythm Abdomen: Soft, active bowel sounds, tender at the low abdomen bilaterally Extremities: No leg edema Skin: Palms and soles without erythema   Portacath/PICC-without erythema  Lab Results: Recent Labs    11/05/17 0522 11/06/17 0627  WBC 10.6* 8.2  HGB 12.3* 12.1*  HCT 36.4* 36.3*  PLT 196 224    BMET Recent Labs    11/05/17 0522 11/06/17 0627  NA 135 138  K 3.7 3.8  CL 101 103  CO2 26 28  GLUCOSE 119* 100*  BUN 14 16  CREATININE 0.99 0.94  CALCIUM 8.4* 8.6*    No results found for: CEA1  Studies/Results: Dg Chest 2 View  Result Date: 11/04/2017 CLINICAL DATA:  57 year old male with cough and fever. EXAM: CHEST  2 VIEW COMPARISON:  Chest CT dated 10/08/2017 FINDINGS: The heart size and mediastinal contours are within normal limits. Both lungs are clear. The visualized skeletal structures are unremarkable. IMPRESSION: No active  cardiopulmonary disease. Electronically Signed   By: Anner Crete M.D.   On: 11/04/2017 22:17   Ct Abdomen Pelvis W Contrast  Result Date: 11/05/2017 CLINICAL DATA:  57 year old male with abdominal pain. History of rectal adenocarcinoma. EXAM: CT ABDOMEN AND PELVIS WITH CONTRAST TECHNIQUE: Multidetector CT imaging of the abdomen and pelvis was performed using the standard protocol following bolus administration of intravenous contrast. CONTRAST:  151mL ISOVUE-300 IOPAMIDOL (ISOVUE-300) INJECTION 61% COMPARISON:  Pelvic MRI dated 10/20/2017 and abdominal CT dated 10/08/2017 FINDINGS: Lower chest: The visualized lung bases are clear. There is a small calcified granuloma in the right posterior costophrenic angle. No intra-abdominal free air or free fluid. Hepatobiliary: Probable mild fatty infiltration of the liver. Small scattered hepatic hypodense lesions, not characterized on this CT corresponding to the cysts noted the prior MRI. These are stable compared to the CT of 10/08/2017. No intrahepatic biliary ductal dilatation. The gallbladder is unremarkable. Pancreas: Unremarkable. No pancreatic ductal dilatation or surrounding inflammatory changes. Spleen: Normal in size without focal abnormality. Adrenals/Urinary Tract: The adrenal glands are unremarkable. Left renal cysts better seen on the prior MRI. The kidneys are otherwise unremarkable. There is symmetric enhancement and excretion of contrast by both kidneys. The visualized ureters and urinary bladder appear unremarkable. Stomach/Bowel: There is sigmoid diverticulosis without active inflammatory changes compatible with acute diverticulitis. Focally contained extraluminal air adjacent to the sigmoid colon. No diverticular abscess. No bowel obstruction. The known rectal mass is not evaluated on today's exam. No perirectal adenopathy. Normal appendix. Vascular/Lymphatic: Mild aortoiliac  atherosclerotic disease. The abdominal aorta and IVC are otherwise  unremarkable. No portal venous gas. There is no adenopathy. Reproductive: The prostate and seminal vesicles are grossly unremarkable. Other: Small fat containing umbilical hernia.  No fluid collection. Musculoskeletal: Grade 1 L4-L5 anterolisthesis. No acute osseous pathology. IMPRESSION: 1. Sigmoid diverticulitis with focally contained diverticular perforation. No abscess. 2. No bowel obstruction.  Normal appendix. 3. Small hepatic and left renal cysts. 4.  Aortic Atherosclerosis (ICD10-I70.0). These results were called by telephone at the time of interpretation on 11/05/2017 at 12:17 am to Dr. Sherwood Gambler , who verbally acknowledged these results. Electronically Signed   By: Anner Crete M.D.   On: 11/05/2017 00:19    Medications: I have reviewed the patient's current medications.  Assessment/Plan: 1. Rectal cancer-invasive adenocarcinoma confirmed on biopsy of a mid rectal mass 10/03/2017 ? Staging CTs from 10/08/2017, indeterminate liver lesions, no rectal mass identified.  No lymphadenopathy. ? MRI abdomen/pelvis 10/20/2017-no suspicious liver lesions, T3 N0 M0 staging with tumor measured at 9 cm from the anal sphincter ? Initiation of Xeloda/radiation 10/27/2017, held beginning 11/05/2017 secondary to diverticulitis 2.    Distal sigmoid polyp on colonoscopy 10/03/2017-invasive adenocarcinoma involving a tubular adenoma, invasive carcinoma measured 1.1 cm with no lymphovascular space involvement and negative resection margin        Flexible sigmoidoscopy 10/08/2017-no residual polyp identified, area of previous polyp at 22 cm tattooed   3.  Admission 11/04/2017 with perforated diverticulitis  Jeffrey Nolan is admitted with diverticulitis.  I doubt this presentation is related to the rectal cancer diagnosis or current treatment.  He will continue antibiotics and management of the diverticulitis per Dr. Dalbert Batman.  I discussed the case with Dr. Dalbert Batman.  Xeloda/radiation will be placed on hold until  he is tolerating a diet.  Outpatient follow-up will be scheduled at the Cancer center.     LOS: 1 day   Betsy Coder, MD   11/06/2017, 9:05 AM

## 2017-11-06 NOTE — Progress Notes (Signed)
Pharmacy Antibiotic Note  Jeffrey Nolan is a 57 y.o. male admitted on 11/04/2017 with contained perforated diverticulitis w/o abscess. Pharmacy has been consulted for zosyn dosing.  Plan:  Continue Zosyn 3.375 g IV given every 8 hrs by 4-hr infusion  Pharmacy will sign off but follow peripherally for cultures and renal adjustments    Temp (24hrs), Avg:98.8 F (37.1 C), Min:98.5 F (36.9 C), Max:99.4 F (37.4 C)  Recent Labs  Lab 11/04/17 2227 11/04/17 2231 11/05/17 0522 11/06/17 0627  WBC 11.3*  --  10.6* 8.2  CREATININE 1.01  --  0.99 0.94  LATICACIDVEN  --  0.99  --   --     CrCl cannot be calculated (Unknown ideal weight.).    No Known Allergies  Thank you for allowing pharmacy to be a part of this patient's care.  Reuel Boom, PharmD, BCPS [phone] 11/06/2017, 11:33 AM

## 2017-11-06 NOTE — Discharge Instructions (Addendum)
Diverticulitis Diverticulitis is when small pockets in your large intestine (colon) get infected or swollen. This causes stomach pain and watery poop (diarrhea). These pouches are called diverticula. They form in people who have a condition called diverticulosis. Follow these instructions at home: Medicines  Take over-the-counter and prescription medicines only as told by your doctor. These include: ? Antibiotics. ? Pain medicines. ? Fiber pills. ? Probiotics. ? Stool softeners.  Do not drive or use heavy machinery while taking prescription pain medicine.  If you were prescribed an antibiotic, take it as told. Do not stop taking it even if you feel better. General instructions  Follow a diet as told by your doctor.  When you feel better, your doctor may tell you to change your diet. You may need to eat a lot of fiber. Fiber makes it easier to poop (have bowel movements). Healthy foods with fiber include: ? Berries. ? Beans. ? Lentils. ? Green vegetables.  Exercise 3 or more times a week. Aim for 30 minutes each time. Exercise enough to sweat and make your heart beat faster.  Keep all follow-up visits as told. This is important. You may need to have an exam of the large intestine. This is called a colonoscopy. Contact a doctor if:  Your pain does not get better.  You have a hard time eating or drinking.  You are not pooping like normal. Get help right away if:  Your pain gets worse.  Your problems do not get better.  Your problems get worse very fast.  You have a fever.  You throw up (vomit) more than one time.  You have poop that is: ? Bloody. ? Black. ? Tarry. Summary  Diverticulitis is when small pockets in your large intestine (colon) get infected or swollen.  Take medicines only as told by your doctor.  Follow a diet as told by your doctor. This information is not intended to replace advice given to you by your health care provider. Make sure you discuss  any questions you have with your health care provider. Document Released: 03/25/2008 Document Revised: 10/24/2016 Document Reviewed: 10/24/2016 Elsevier Interactive Patient Education  2017 [employer]   Low-Fiber Diet Fiber is found in fruits, vegetables, and whole grains. A low-fiber diet restricts fibrous foods that are not digested in the small intestine. A diet containing about 10-15 grams of fiber per day is considered low fiber. Low-fiber diets may be used to:  Promote healing and rest the bowel during intestinal flare-ups.  Prevent blockage of a partially obstructed or narrowed gastrointestinal tract.  Reduce fecal weight and volume.  Slow the movement of feces.  You may be on a low-fiber diet as a transitional diet following surgery, after an injury (trauma), or because of a short (acute) or lifelong (chronic) illness. Your health care provider will determine the length of time you need to stay on this diet. What do I need to know about a low-fiber diet? Always check the fiber content on the packaging's Nutrition Facts label, especially on foods from the grains list. Ask your dietitian if you have questions about specific foods that are related to your condition, especially if the food is not listed below. In general, a low-fiber food will have less than 2 g of fiber. What foods can I eat? Grains All breads and crackers made with white flour. Sweet rolls, doughnuts, waffles, pancakes, [place] toast, bagels. Pretzels, Melba toast, zwieback. Well-cooked cereals, such as cornmeal, farina, or cream cereals. Dry cereals that do not  contain whole grains, fruit, or nuts, such as refined corn, wheat, rice, and oat cereals. Potatoes prepared any way without skins, plain pastas and noodles, refined white rice. Use white flour for baking and making sauces. Use allowed list of grains for casseroles, dumplings, and puddings. Vegetables Strained tomato and vegetable juices. Fresh lettuce,  cucumber, spinach. Well-cooked (no skin or pulp) or canned vegetables, such as asparagus, bean sprouts, beets, carrots, green beans, mushrooms, potatoes, pumpkin, spinach, yellow squash, tomato sauce/puree, turnips, yams, and zucchini. Keep servings limited to  cup. Fruits All fruit juices except prune juice. Cooked or canned fruits without skin and seeds, such as applesauce, apricots, cherries, fruit cocktail, grapefruit, grapes, mandarin oranges, melons, peaches, pears, pineapple, and plums. Fresh fruits without skin, such as apricots, avocados, bananas, melons, pineapple, nectarines, and peaches. Keep servings limited to  cup or 1 piece. Meat and Other Protein Sources Ground or well-cooked tender beef, ham, veal, lamb, pork, or poultry. Eggs, plain cheese. Fish, oysters, shrimp, lobster, and other seafood. Liver, organ meats. Smooth nut butters. Dairy All milk products and alternative dairy substitutes, such as soy, rice, almond, and coconut, not containing added whole nuts, seeds, or added fruit. Beverages Decaf coffee, fruit, and vegetable juices or smoothies (small amounts, with no pulp or skins, and with fruits from allowed list), sports drinks, herbal tea. Condiments Ketchup, mustard, vinegar, cream sauce, cheese sauce, cocoa powder. Spices in moderation, such as allspice, basil, bay leaves, celery powder or leaves, cinnamon, cumin powder, curry powder, ginger, mace, marjoram, onion or garlic powder, oregano, paprika, parsley flakes, ground pepper, rosemary, sage, savory, tarragon, thyme, and turmeric. Sweets and Desserts Plain cakes and cookies, pie made with allowed fruit, pudding, custard, cream pie. Gelatin, fruit, ice, sherbet, frozen ice pops. Ice cream, ice milk without nuts. Plain hard candy, honey, jelly, molasses, syrup, sugar, chocolate syrup, gumdrops, marshmallows. Limit overall sugar intake. Fats and Oil Margarine, butter, cream, mayonnaise, salad oils, plain salad dressings  made from allowed foods. Choose healthy fats such as olive oil, canola oil, and omega-3 fatty acids (such as found in salmon or tuna) when possible. Other Bouillon, broth, or cream soups made from allowed foods. Any strained soup. Casseroles or mixed dishes made with allowed foods. The items listed above may not be a complete list of recommended foods or beverages. Contact your dietitian for more options. What foods are not recommended? Grains All whole wheat and whole grain breads and crackers. Multigrains, rye, bran seeds, nuts, or coconut. Cereals containing whole grains, multigrains, bran, coconut, nuts, raisins. Cooked or dry oatmeal, steel-cut oats. Coarse wheat cereals, granola. Cereals advertised as high fiber. Potato skins. Whole grain pasta, wild or brown rice. Popcorn. Coconut flour. Bran, buckwheat, corn bread, multigrains, rye, wheat germ. Vegetables Fresh, cooked or canned vegetables, such as artichokes, asparagus, beet greens, broccoli, Brussels sprouts, cabbage, celery, cauliflower, corn, eggplant, kale, legumes or beans, okra, peas, and tomatoes. Avoid large servings of any vegetables, especially raw vegetables. Fruits Fresh fruits, such as apples with or without skin, berries, cherries, figs, grapes, grapefruit, guavas, kiwis, mangoes, oranges, papayas, pears, persimmons, pineapple, and pomegranate. Prune juice and juices with pulp, stewed or dried prunes. Dried fruits, dates, raisins. Fruit seeds or skins. Avoid large servings of all fresh fruits. Meats and Other Protein Sources Tough, fibrous meats with gristle. Chunky nut butter. Cheese made with seeds, nuts, or other foods not recommended. Nuts, seeds, legumes (beans, including baked beans), dried peas, beans, lentils. Dairy Yogurt or cheese that contains nuts, seeds, or added  fruit. Beverages Fruit juices with high pulp, prune juice. Caffeinated coffee and teas. Condiments Coconut, maple syrup, pickles, olives. Sweets and  Desserts Desserts, cookies, or candies that contain nuts or coconut, chunky peanut butter, dried fruits. Jams, preserves with seeds, marmalade. Large amounts of sugar and sweets. Any other dessert made with fruits from the not recommended list. Other Soups made from vegetables that are not recommended or that contain other foods not recommended. The items listed above may not be a complete list of foods and beverages to avoid. Contact your dietitian for more information. This information is not intended to replace advice given to you by your health care provider. Make sure you discuss any questions you have with your health care provider. Document Released: 03/29/2002 Document Revised: 03/14/2016 Document Reviewed: 08/30/2013 Elsevier Interactive Patient Education  2017 [place].   Colorectal Cancer Colorectal cancer is an abnormal growth of cells and tissue (tumor) in the colon or rectum, which are parts of the large intestine. The cancer can spread (metastasize) to other parts of the body. What are the causes? Most cases of colorectal cancer start as abnormal growths called polyps on the inner wall of the colon or rectum. Other times, abnormal changes to genes (genetic mutations) can cause cells to form cancer. What increases the risk? You are more likely to develop this condition if:  You are older than age 18.  You have multiple polyps in the colon or rectum.  You have diabetes.  You are African American.  You have a family history of Lynch syndrome.  You have had cancer before.  You have certain hereditary conditions, such as: ? Familial adenomatous polyposis. ? Turcot syndrome. ? Peutz-Jeghers syndrome.  You eat a diet that is high in fat (especially animal fat) and low in fiber, fruits, and vegetables.  You have an inactive (sedentary) lifestyle.  You have an inflammatory bowel disease or Crohn disease.  You smoke.  You drink alcohol excessively.  What are the  signs or symptoms? Early colorectal cancer often does not cause symptoms. As the cancer grows, symptoms may include:  Changes in bowel habits.  Feeling like the bowel does not empty completely after a bowel movement.  Stools that are narrower than usual.  Blood in the stool.  Diarrhea.  Constipation.  Anemia.  Discomfort, pain, bloating, fullness, or cramps in the abdomen.  Frequent gas pain.  Unexplained weight loss.  Constant fatigue.  Nausea and vomiting.  How is this diagnosed? This condition may be diagnosed with:  A medical history.  A physical exam.  Tests. These may include: ? A digital rectal exam. ? A stool test called a fecal occult blood test. ? Blood tests. ? A test in which a tissue sample is taken from the colon or rectum and examined under a microscope (biopsy). ? Imaging tests, such as:  X-rays.  A barium enema.  CT scans.  MRIs.  A sigmoidoscopy. This test is done to view the inside of the rectum.  A colonoscopy. This test is done to view the inside of the colon. During this test, small polyps can be removed or biopsies may be taken.  An endorectal ultrasound. This test checks how deep a tumor in the rectum has grown and whether the cancer has spread to lymph nodes or other nearby tissues.  Your health care provider may order additional tests to find out whether the cancer has spread to other parts of the body (what stage it is). The stages of cancer  include:  Stage 0. At this stage, the cancer is found only in the innermost lining of the colon or rectum.  Stage I. At this stage, the cancer has grown into the inner wall of the colon or rectum.  Stage II. At this stage, the cancer has gone more deeply into the wall of the colon or rectum or through the wall. It may have invaded nearby tissue.  Stage III. At this stage, the cancer has spread to nearby lymph nodes.  Stage IV. At this stage, the cancer has spread to other parts of the  body, such as the liver or lungs.  How is this treated? Treatment for this condition depends on the type and stage of the cancer. Treatment may include:  Surgery. In the early stages of the cancer, surgery may be done to remove polyps or small tumors from the colon. In later stages, surgery may be done to remove part of the colon.  Chemotherapy. This treatment uses medicines to kill cancer cells.  Targeted therapy. This treatment targets specific gene mutations or proteins that the cancer expresses in order to kill tumor cells.  Radiation therapy. This treatment uses radiation to kill cancer cells or shrink tumors.  Radiofrequency ablation. This treatment uses radio waves to destroy the tumors that may have spread to other areas of the body, such as the liver.  Follow these instructions at home:  Take over-the-counter and prescription medicines only as told by your health care provider.  Try to eat regular, healthy meals. Some of your treatments might affect your appetite. If you are having problems eating or with your appetite, ask to meet with a food and nutrition specialist (dietitian).  Consider joining a support group. This may help you learn about your diagnosis and cope with the stress of having colorectal cancer.  If you are admitted to the hospital, inform your cancer care team.  Keep all follow-up visits as told by your health care provider. This is important. How is this prevented?  Colorectal cancer can be prevented with screening tests that find polyps so they can be removed before they develop into cancer.  Most people with average risk of colorectal cancer should start screening at age 101. People at increased risk should start screening at an earlier age. Where to find more information:  American Cancer Society: https://www.cancer.[place] https://www.cancer.gov Contact a health care provider if:  Your diarrhea or constipation does not  go away.  You have blood in your stool.  Your bowel habits change.  You have increased pain in your abdomen.  You notice new fatigue or weakness.  You lose weight. Get help right away if:  You have increased bleeding from your rectum.  You have any uncontrollable or severe abdomen (abdominal) symptoms. Summary  Colorectal cancer is an abnormal growth of cells and tissue (tumor) in the colon or rectum.  Common risk factors for this condition include having a relative with colon cancer, being older in age, having an inflammatory bowel disease, and being African American.  This condition may be diagnosed with tests, such as a colonoscopy and biopsy.  Treatment depends on the type and stage of the cancer. Commonly, treatment includes surgery to remove the tumor along with chemotherapy or targeted therapy.  Keep all follow-up visits as told by your health care provider. This is important. This information is not intended to replace advice given to you by your health care provider. Make sure you discuss any questions you  have with your health care provider. Document Released: 10/07/2005 Document Revised: 11/08/2016 Document Reviewed: 11/08/2016 Elsevier Interactive Patient Education  [employer].

## 2017-11-06 NOTE — Progress Notes (Signed)
Patient ID: Jeffrey Nolan, male   DOB: 07-21-1961, 57 y.o.   MRN: [id number]  PROGRESS NOTE    Jeffrey Nolan  IZT:[id number] DOB: 13-May-1961 DOA: 11/04/2017 PCP: Patient, No Pcp Per   Brief Narrative:  57 year old male with history of adenocarcinoma of the rectum currently on radiation and chemotherapy presented with abdominal pain.  He was admitted with acute diverticulitis with contained perforation and started on intravenous antibiotics.  General surgery and oncology were consulted.   Assessment & Plan:   Principal Problem:   Diverticulitis of colon with perforation Active Problems:   Rectal adenocarcinoma ([place])   Leukocytosis  Acute diverticulitis with contained perforation -Continue Zosyn.  Follow cultures -General surgery following.  Diet will be advanced  as per general surgery  -Continue pain management  Leukocytosis:  -Probably second to above.  Resolved  Rectal adenocarcinoma: Patient on oral medications Xeloda and followed by Dr. Benay Spice of oncology.  Also currently receiving radiation therapy followed by Dr. Lisbeth Renshaw of radiation oncology. - Oncology evaluation appreciated.  Chemotherapy and radiation to be on hold for now until he is tolerating a diet.   DVT prophylaxis: Lovenox Code Status: Full Family Communication: None at bedside Disposition Plan: Depends on clinical outcome  Consultants: General surgery, oncology  Procedures: None  Antimicrobials: Zosyn from 11/04/2017 onwards   Subjective: Patient seen and examined at bedside.  He feels slightly better.  His abdominal pain is improving.  No overnight fever or vomiting.  Objective: Vitals:   11/05/17 0711 11/05/17 1632 11/05/17 2054 11/06/17 0628  BP: (!) 152/81 111/77 126/65 105/60  Pulse: 88 77 81 79  Resp: 19 20 18 16   Temp:  98.6 F (37 C) 98.5 F (36.9 C) 99.4 F (37.4 C)  TempSrc:  Oral Oral Oral  SpO2: 97% 98% 97% 96%    Intake/Output Summary (Last 24 hours) at 11/06/2017 3825 Last  data filed at 11/06/2017 0400 Gross per 24 hour  Intake 2236.66 ml  Output -  Net 2236.66 ml   There were no vitals filed for this visit.  Examination:  General exam: Appears calm and comfortable  Respiratory system: Bilateral decreased breath sound at bases Cardiovascular system: S1 & S2 heard, controlled  gastrointestinal system: Abdomen is nondistended, soft and mildly tender in the lower quadrant. Normal bowel sounds heard. Extremities: No cyanosis, clubbing, edema    Data Reviewed: I have personally reviewed following labs and imaging studies  CBC: Recent Labs  Lab 11/04/17 2227 11/05/17 0522 11/06/17 0627  WBC 11.3* 10.6* 8.2  NEUTROABS 9.4*  --  6.6  HGB 13.0 12.3* 12.1*  HCT 39.2 36.4* 36.3*  MCV 86.3 86.1 86.6  PLT 229 196 053   Basic Metabolic Panel: Recent Labs  Lab 11/04/17 2227 11/05/17 0522 11/06/17 0627  NA 137 135 138  K 3.8 3.7 3.8  CL 103 101 103  CO2 26 26 28   GLUCOSE 106* 119* 100*  BUN 19 14 16   CREATININE 1.01 0.99 0.94  CALCIUM 8.7* 8.4* 8.6*  MG  --   --  2.0   GFR: CrCl cannot be calculated (Unknown ideal weight.). Liver Function Tests: Recent Labs  Lab 11/04/17 2227  AST 21  ALT 24  ALKPHOS 65  BILITOT 1.0  PROT 6.8  ALBUMIN 3.7   Recent Labs  Lab 11/04/17 2227  LIPASE 22   No results for input(s): AMMONIA in the last 168 hours. Coagulation Profile: No results for input(s): INR, PROTIME in the last 168 hours. Cardiac Enzymes: No results for input(s):  CKTOTAL, CKMB, CKMBINDEX, TROPONINI in the last 168 hours. BNP (last 3 results) No results for input(s): PROBNP in the last 8760 hours. HbA1C: No results for input(s): HGBA1C in the last 72 hours. CBG: No results for input(s): GLUCAP in the last 168 hours. Lipid Profile: No results for input(s): CHOL, HDL, LDLCALC, TRIG, CHOLHDL, LDLDIRECT in the last 72 hours. Thyroid Function Tests: No results for input(s): TSH, T4TOTAL, FREET4, T3FREE, THYROIDAB in the last 72  hours. Anemia Panel: No results for input(s): VITAMINB12, FOLATE, FERRITIN, TIBC, IRON, RETICCTPCT in the last 72 hours. Sepsis Labs: Recent Labs  Lab 11/04/17 2231  LATICACIDVEN 0.99    Recent Results (from the past 240 hour(s))  Culture, blood (routine x 2)     Status: None (Preliminary result)   Collection Time: 11/04/17 10:26 PM  Result Value Ref Range Status   Specimen Description BLOOD RIGHT HAND  Final   Special Requests   Final    BOTTLES DRAWN AEROBIC AND ANAEROBIC Blood Culture adequate volume   Culture   Final    NO GROWTH 1 DAY Performed at [facility], [place] [address] [place], [place] [address]    Report Status PENDING  Incomplete  Culture, blood (routine x 2)     Status: None (Preliminary result)   Collection Time: 11/04/17 10:50 PM  Result Value Ref Range Status   Specimen Description BLOOD  Final   Special Requests NONE  Final   Culture   Final    NO GROWTH 1 DAY Performed at [facility], [place] [address], [place], [place] [address]    Report Status PENDING  Incomplete         Radiology Studies: Dg Chest 2 View  Result Date: 11/04/2017 CLINICAL DATA:  57 year old male with cough and fever. EXAM: CHEST  2 VIEW COMPARISON:  Chest CT dated 10/08/2017 FINDINGS: The heart size and mediastinal contours are within normal limits. Both lungs are clear. The visualized skeletal structures are unremarkable. IMPRESSION: No active cardiopulmonary disease. Electronically Signed   By: Anner Crete M.D.   On: 11/04/2017 22:17   Ct Abdomen Pelvis W Contrast  Result Date: 11/05/2017 CLINICAL DATA:  57 year old male with abdominal pain. History of rectal adenocarcinoma. EXAM: CT ABDOMEN AND PELVIS WITH CONTRAST TECHNIQUE: Multidetector CT imaging of the abdomen and pelvis was performed using the standard protocol following bolus administration of intravenous contrast. CONTRAST:  147mL ISOVUE-300 IOPAMIDOL (ISOVUE-300) INJECTION 61% COMPARISON:  Pelvic MRI  dated 10/20/2017 and abdominal CT dated 10/08/2017 FINDINGS: Lower chest: The visualized lung bases are clear. There is a small calcified granuloma in the right posterior costophrenic angle. No intra-abdominal free air or free fluid. Hepatobiliary: Probable mild fatty infiltration of the liver. Small scattered hepatic hypodense lesions, not characterized on this CT corresponding to the cysts noted the prior MRI. These are stable compared to the CT of 10/08/2017. No intrahepatic biliary ductal dilatation. The gallbladder is unremarkable. Pancreas: Unremarkable. No pancreatic ductal dilatation or surrounding inflammatory changes. Spleen: Normal in size without focal abnormality. Adrenals/Urinary Tract: The adrenal glands are unremarkable. Left renal cysts better seen on the prior MRI. The kidneys are otherwise unremarkable. There is symmetric enhancement and excretion of contrast by both kidneys. The visualized ureters and urinary bladder appear unremarkable. Stomach/Bowel: There is sigmoid diverticulosis without active inflammatory changes compatible with acute diverticulitis. Focally contained extraluminal air adjacent to the sigmoid colon. No diverticular abscess. No bowel obstruction. The known rectal mass is not evaluated on today's exam. No perirectal  adenopathy. Normal appendix. Vascular/Lymphatic: Mild aortoiliac atherosclerotic disease. The abdominal aorta and IVC are otherwise unremarkable. No portal venous gas. There is no adenopathy. Reproductive: The prostate and seminal vesicles are grossly unremarkable. Other: Small fat containing umbilical hernia.  No fluid collection. Musculoskeletal: Grade 1 L4-L5 anterolisthesis. No acute osseous pathology. IMPRESSION: 1. Sigmoid diverticulitis with focally contained diverticular perforation. No abscess. 2. No bowel obstruction.  Normal appendix. 3. Small hepatic and left renal cysts. 4.  Aortic Atherosclerosis (ICD10-I70.0). These results were called by telephone  at the time of interpretation on 11/05/2017 at 12:17 am to Dr. Sherwood Gambler , who verbally acknowledged these results. Electronically Signed   By: Anner Crete M.D.   On: 11/05/2017 00:19        Scheduled Meds: . enoxaparin (LOVENOX) injection  40 mg Subcutaneous Q24H   Continuous Infusions: . sodium chloride 100 mL/hr at 11/06/17 0458  . piperacillin-tazobactam (ZOSYN)  IV Stopped (11/06/17 0824)     LOS: 1 day        Aline August, MD [facility] Pager [phone]  If 7PM-7AM, please contact night-coverage [email or web address] Password [facility] 11/06/2017, 9:23 AM

## 2017-11-07 ENCOUNTER — Ambulatory Visit: Payer: Federal, State, Local not specified - PPO

## 2017-11-07 ENCOUNTER — Encounter: Payer: Self-pay | Admitting: Oncology

## 2017-11-07 ENCOUNTER — Ambulatory Visit: Payer: Federal, State, Local not specified - PPO | Admitting: Nurse Practitioner

## 2017-11-07 ENCOUNTER — Other Ambulatory Visit: Payer: Federal, State, Local not specified - PPO

## 2017-11-07 DIAGNOSIS — C20 Malignant neoplasm of rectum: Secondary | ICD-10-CM | POA: Diagnosis not present

## 2017-11-07 LAB — BASIC METABOLIC PANEL
Anion gap: 5 (ref 5–15)
BUN: 13 mg/dL (ref 6–20)
CALCIUM: 8.5 mg/dL — AB (ref 8.9–10.3)
CHLORIDE: 105 mmol/L (ref 101–111)
CO2: 29 mmol/L (ref 22–32)
Creatinine, Ser: 0.96 mg/dL (ref 0.61–1.24)
GFR calc non Af Amer: >60 mL/min (ref >60)
Glucose, Bld: 104 mg/dL — ABNORMAL HIGH (ref 65–99)
Potassium: 3.5 mmol/L (ref 3.5–5.1)
SODIUM: 139 mmol/L (ref 135–145)

## 2017-11-07 LAB — CBC
HEMATOCRIT: 35 % — AB (ref 39.0–52.0)
HEMOGLOBIN: 11.6 g/dL — AB (ref 13.0–17.0)
MCH: 28.8 pg (ref 26.0–34.0)
MCHC: 33.1 g/dL (ref 30.0–36.0)
MCV: 86.8 fL (ref 78.0–100.0)
Platelets: 204 10*3/uL (ref 150–400)
RBC: 4.03 MIL/uL — ABNORMAL LOW (ref 4.22–5.81)
RDW: 12.7 % (ref 11.5–15.5)
WBC: 5.9 10*3/uL (ref 4.0–10.5)

## 2017-11-07 LAB — MAGNESIUM: Magnesium: 2 mg/dL (ref 1.7–2.4)

## 2017-11-07 MED ORDER — DIPHENHYDRAMINE-ZINC ACETATE 2-0.1 % EX CREA: TOPICAL_CREAM | Freq: Three times a day (TID) | CUTANEOUS | Status: DC | PRN

## 2017-11-07 MED ORDER — DIPHENHYDRAMINE-ZINC ACETATE 2-0.1 % EX CREA
TOPICAL_CREAM | Freq: Three times a day (TID) | CUTANEOUS | Status: DC | PRN
  Administered 2017-11-07: 22:00:00 via TOPICAL
  Filled 2017-11-07: qty 28

## 2017-11-07 NOTE — Progress Notes (Signed)
Having to switch diet in Epic for the patient to be able to order scrambled eggs per Dr. Dalbert Batman. Will change back to full liquid once he get his tray

## 2017-11-07 NOTE — Progress Notes (Signed)
Patient ID: Jeffrey Nolan, male   DOB: [birth year], 57 y.o.   MRN: [id number]  PROGRESS NOTE    Husam Hohn  FIE:[id number] DOB: May 11, 1961 DOA: 11/04/2017 PCP: Patient, No Pcp Per   Brief Narrative:  57 year old male with history of adenocarcinoma of the rectum currently on radiation and chemotherapy presented with abdominal pain.  He was admitted with acute diverticulitis with contained perforation and started on intravenous antibiotics.  General surgery and oncology were consulted.   Assessment & Plan:   Principal Problem:   Diverticulitis of colon with perforation Active Problems:   Rectal adenocarcinoma ([place])   Leukocytosis  Acute diverticulitis with contained perforation -Continue Zosyn.  Cultures negative so far -General surgery following.  Diet will be advanced  as per general surgery  -Continue pain management -Repeat a.m. labs  Leukocytosis:  -Probably second to above.  Resolved  Rectal adenocarcinoma: Patient on oral medications Xeloda and followed by Dr. Benay Spice of oncology.  Also currently receiving radiation therapy followed by Dr. Lisbeth Renshaw of radiation oncology. - Oncology evaluation appreciated.  Chemotherapy and radiation to be on hold for now until he is tolerating a diet.   DVT prophylaxis: Lovenox Code Status: Full Family Communication: None at bedside Disposition Plan: Depends on clinical outcome  Consultants: General surgery, oncology  Procedures: None  Antimicrobials: Zosyn from 11/04/2017 onwards   Subjective: Patient seen and examined at bedside.  His abdominal pain is improving.  No overnight fever or vomiting.  Objective: Vitals:   11/06/17 0628 11/06/17 1521 11/06/17 2137 11/07/17 0609  BP: 105/60 (!) 125/55 (!) 112/49 115/70  Pulse: 79 98 65 66  Resp: 16 16 16 16   Temp: 99.4 F (37.4 C) 97.8 F (36.6 C) 98 F (36.7 C) 98.3 F (36.8 C)  TempSrc: Oral Oral Oral Oral  SpO2: 96% 96% 99% 97%   No intake or output data in the 24 hours  ending 11/07/17 1014 There were no vitals filed for this visit.  Examination:  General exam: Appears calm and comfortable.  No distress Respiratory system: Bilateral decreased breath sound at bases Cardiovascular system: S1 & S2 heard, controlled  gastrointestinal system: Abdomen is nondistended, soft and mildly tender in the lower quadrant. Normal bowel sounds heard. Extremities: No cyanosis, clubbing, edema    Data Reviewed: I have personally reviewed following labs and imaging studies  CBC: Recent Labs  Lab 11/04/17 2227 11/05/17 0522 11/06/17 0627 11/07/17 0557  WBC 11.3* 10.6* 8.2 5.9  NEUTROABS 9.4*  --  6.6  --   HGB 13.0 12.3* 12.1* 11.6*  HCT 39.2 36.4* 36.3* 35.0*  MCV 86.3 86.1 86.6 86.8  PLT 229 196 224 166   Basic Metabolic Panel: Recent Labs  Lab 11/04/17 2227 11/05/17 0522 11/06/17 0627 11/07/17 0557  NA 137 135 138 139  K 3.8 3.7 3.8 3.5  CL 103 101 103 105  CO2 26 26 28 29   GLUCOSE 106* 119* 100* 104*  BUN 19 14 16 13   CREATININE 1.01 0.99 0.94 0.96  CALCIUM 8.7* 8.4* 8.6* 8.5*  MG  --   --  2.0 2.0   GFR: CrCl cannot be calculated (Unknown ideal weight.). Liver Function Tests: Recent Labs  Lab 11/04/17 2227  AST 21  ALT 24  ALKPHOS 65  BILITOT 1.0  PROT 6.8  ALBUMIN 3.7   Recent Labs  Lab 11/04/17 2227  LIPASE 22   No results for input(s): AMMONIA in the last 168 hours. Coagulation Profile: No results for input(s): INR, PROTIME in the last 168  hours. Cardiac Enzymes: No results for input(s): CKTOTAL, CKMB, CKMBINDEX, TROPONINI in the last 168 hours. BNP (last 3 results) No results for input(s): PROBNP in the last 8760 hours. HbA1C: No results for input(s): HGBA1C in the last 72 hours. CBG: No results for input(s): GLUCAP in the last 168 hours. Lipid Profile: No results for input(s): CHOL, HDL, LDLCALC, TRIG, CHOLHDL, LDLDIRECT in the last 72 hours. Thyroid Function Tests: No results for input(s): TSH, T4TOTAL, FREET4,  T3FREE, THYROIDAB in the last 72 hours. Anemia Panel: No results for input(s): VITAMINB12, FOLATE, FERRITIN, TIBC, IRON, RETICCTPCT in the last 72 hours. Sepsis Labs: Recent Labs  Lab 11/04/17 2231  LATICACIDVEN 0.99    Recent Results (from the past 240 hour(s))  Culture, blood (routine x 2)     Status: None (Preliminary result)   Collection Time: 11/04/17 10:26 PM  Result Value Ref Range Status   Specimen Description BLOOD RIGHT HAND  Final   Special Requests   Final    BOTTLES DRAWN AEROBIC AND ANAEROBIC Blood Culture adequate volume   Culture   Final    NO GROWTH 1 DAY Performed at [facility], [place] [address] [place], [place] [address]    Report Status PENDING  Incomplete  Culture, blood (routine x 2)     Status: None (Preliminary result)   Collection Time: 11/04/17 10:50 PM  Result Value Ref Range Status   Specimen Description BLOOD  Final   Special Requests NONE  Final   Culture   Final    NO GROWTH 1 DAY Performed at [facility], [place] [address], [place], [place] [address]    Report Status PENDING  Incomplete         Radiology Studies: No results found.      Scheduled Meds: . enoxaparin (LOVENOX) injection  40 mg Subcutaneous Q24H   Continuous Infusions: . sodium chloride 75 mL/hr at 11/06/17 1006  . piperacillin-tazobactam (ZOSYN)  IV 3.375 g (11/07/17 0535)     LOS: 2 days        Aline August, MD [facility] Pager [phone]  If 7PM-7AM, please contact night-coverage [email or web address] Password [email or web address] 11/07/2017, 10:14 AM

## 2017-11-07 NOTE — Progress Notes (Signed)
Patient's radiation treatment will be canceled today per Dr. Lisbeth Renshaw.  Will check on patient's status on Monday.

## 2017-11-07 NOTE — Progress Notes (Signed)
MD Dalbert Batman gave a verbal order for patient to be on a Full Liquid diet and stated that patient could have scrambled eggs and milkshakes also.

## 2017-11-07 NOTE — Progress Notes (Signed)
[facility] Progress Note     Subjective: CC-  No complaints today. States that his abdominal pain has resolved. He was started on a full liquid diet + eggs this morning. Currently tolerating well. Denies n/v. States that PO intake does not worsen abdominal pain. Passing flatus, no BM. Has been ambulating without issues. Called Dr. Drue Flirt at [facility] and was able to reschedule appointment for 11/26/17.  Objective: Vital signs in last 24 hours: Temp:  [97.8 F (36.6 C)-98.3 F (36.8 C)] 98.3 F (36.8 C) (01/18 0609) Pulse Rate:  [65-98] 66 (01/18 0609) Resp:  [16] 16 (01/18 0609) BP: (112-125)/(49-70) 115/70 (01/18 0609) SpO2:  [96 %-99 %] 97 % (01/18 0609) Last BM Date: 11/04/17  Intake/Output from previous day: No intake/output data recorded. Intake/Output this shift: No intake/output data recorded.  PE: Gen:  Alert, NAD, pleasant HEENT: EOM's intact, pupils equal and round Card:  RRR, no M/G/R heard Pulm:  CTAB, no W/R/R, effort normal Abd: Soft, mild distension, +BS, no HSM, no hernia. Subjective tenderness to LLQ and suprapubic region, no rebound or guarding Ext:  No erythema, edema, or tenderness BUE/BLE  Psych: A&Ox3  Skin: no rashes noted, warm and dry  Lab Results:  Recent Labs    11/06/17 0627 11/07/17 0557  WBC 8.2 5.9  HGB 12.1* 11.6*  HCT 36.3* 35.0*  PLT 224 204   BMET Recent Labs    11/06/17 0627 11/07/17 0557  NA 138 139  K 3.8 3.5  CL 103 105  CO2 28 29  GLUCOSE 100* 104*  BUN 16 13  CREATININE 0.94 0.96  CALCIUM 8.6* 8.5*   PT/INR No results for input(s): LABPROT, INR in the last 72 hours. CMP     Component Value Date/Time   NA 139 11/07/2017 0557   K 3.5 11/07/2017 0557   CL 105 11/07/2017 0557   CO2 29 11/07/2017 0557   GLUCOSE 104 (H) 11/07/2017 0557   BUN 13 11/07/2017 0557   CREATININE 0.96 11/07/2017 0557   CALCIUM 8.5 (L) 11/07/2017 0557   PROT 6.8 11/04/2017 2227   ALBUMIN 3.7 11/04/2017 2227   AST 21  11/04/2017 2227   ALT 24 11/04/2017 2227   ALKPHOS 65 11/04/2017 2227   BILITOT 1.0 11/04/2017 2227   GFRNONAA >60 11/07/2017 0557   GFRAA >60 11/07/2017 0557   Lipase     Component Value Date/Time   LIPASE 22 11/04/2017 2227       Studies/Results: No results found.  Anti-infectives: Anti-infectives (From admission, onward)   Start     Dose/Rate Route Frequency Ordered Stop   11/05/17 0600  piperacillin-tazobactam (ZOSYN) IVPB 3.375 g     3.375 g 12.5 mL/hr over 240 Minutes Intravenous Every 8 hours 11/05/17 0504     11/05/17 0030  piperacillin-tazobactam (ZOSYN) IVPB 3.375 g     3.375 g 100 mL/hr over 30 Minutes Intravenous  Once 11/05/17 0023 11/05/17 0109       Assessment/Plan Rectal adenocarcinoma - currently on chemo (Xeloda) and radiation followed by Dr. Lisbeth Renshaw and Dr. Benay Spice, planning to see Dr. Drue Flirt at [facility] for surgery - Xeloda on hold per Dr. Benay Spice until tolerating a diet  Diverticulitis with perforation -no prior h/o abdominal surgery, last colonoscopy 10/03/17 showed tumor in mid-rectum as well as mild diverticulosis in the sigmoid colon and in the ascending colon - CT scan showed sigmoid diverticulitis with focally contained diverticular perforation, no abscess - WBC 5.9, VSS - WBC WNL and pain improved. [place] for full  liquids. Continue IV zosyn.  ID -zosyn 1/16>>day#3 VTE -SCDs, lovenox FEN -IVF, full liquids Foley -none Follow up - medical and radiation oncology, Dr. Drue Flirt at [facility]    LOS: 2 days    Wellington Hampshire , [facility] 11/07/2017, 9:22 AM Pager: [phone] Consults: [phone] Mon-Fri 7:00 am-4:30 pm Sat-Sun 7:00 am-11:30 am

## 2017-11-08 ENCOUNTER — Encounter (HOSPITAL_COMMUNITY): Payer: Self-pay | Admitting: Surgery

## 2017-11-08 ENCOUNTER — Inpatient Hospital Stay (HOSPITAL_COMMUNITY): Payer: Federal, State, Local not specified - PPO

## 2017-11-08 DIAGNOSIS — E876 Hypokalemia: Secondary | ICD-10-CM

## 2017-11-08 DIAGNOSIS — K5720 Diverticulitis of large intestine with perforation and abscess without bleeding: Secondary | ICD-10-CM | POA: Diagnosis not present

## 2017-11-08 DIAGNOSIS — K5732 Diverticulitis of large intestine without perforation or abscess without bleeding: Secondary | ICD-10-CM | POA: Diagnosis not present

## 2017-11-08 LAB — BASIC METABOLIC PANEL
ANION GAP: 8 (ref 5–15)
BUN: 9 mg/dL (ref 6–20)
CALCIUM: 8.7 mg/dL — AB (ref 8.9–10.3)
CO2: 28 mmol/L (ref 22–32)
Chloride: 101 mmol/L (ref 101–111)
Creatinine, Ser: 1.06 mg/dL (ref 0.61–1.24)
Glucose, Bld: 104 mg/dL — ABNORMAL HIGH (ref 65–99)
Potassium: 3.4 mmol/L — ABNORMAL LOW (ref 3.5–5.1)
Sodium: 137 mmol/L (ref 135–145)

## 2017-11-08 LAB — CBC WITH DIFFERENTIAL/PLATELET
BASOS ABS: 0 10*3/uL (ref 0.0–0.1)
BASOS PCT: 0 %
Eosinophils Absolute: 0.2 10*3/uL (ref 0.0–0.7)
Eosinophils Relative: 3 %
HCT: 35.3 % — ABNORMAL LOW (ref 39.0–52.0)
Hemoglobin: 11.9 g/dL — ABNORMAL LOW (ref 13.0–17.0)
Lymphocytes Relative: 12 %
Lymphs Abs: 0.7 10*3/uL (ref 0.7–4.0)
MCH: 29.1 pg (ref 26.0–34.0)
MCHC: 33.7 g/dL (ref 30.0–36.0)
MCV: 86.3 fL (ref 78.0–100.0)
MONO ABS: 0.6 10*3/uL (ref 0.1–1.0)
Monocytes Relative: 9 %
NEUTROS PCT: 76 %
Neutro Abs: 4.7 10*3/uL (ref 1.7–7.7)
Platelets: 237 10*3/uL (ref 150–400)
RBC: 4.09 MIL/uL — ABNORMAL LOW (ref 4.22–5.81)
RDW: 12.6 % (ref 11.5–15.5)
WBC: 6.2 10*3/uL (ref 4.0–10.5)

## 2017-11-08 LAB — MAGNESIUM: MAGNESIUM: 2 mg/dL (ref 1.7–2.4)

## 2017-11-08 MED ORDER — IOPAMIDOL (ISOVUE-300) INJECTION 61%
15.0000 mL | Freq: Once | INTRAVENOUS | Status: AC | PRN
  Administered 2017-11-08: 15 mL via ORAL
  Administered 2017-11-08: 30 mL via ORAL
  Filled 2017-11-08 (×2): qty 30

## 2017-11-08 MED ORDER — MENTHOL 3 MG MT LOZG: 1.0000 | LOZENGE | OROMUCOSAL | Status: DC | PRN

## 2017-11-08 MED ORDER — LACTATED RINGERS IV BOLUS (SEPSIS): 1000.0000 mL | Freq: Three times a day (TID) | INTRAVENOUS | Status: DC | PRN

## 2017-11-08 MED ORDER — SODIUM CHLORIDE 0.9% FLUSH
3.0000 mL | Freq: Two times a day (BID) | INTRAVENOUS | Status: DC
  Administered 2017-11-08: 3 mL via INTRAVENOUS

## 2017-11-08 MED ORDER — IOPAMIDOL (ISOVUE-300) INJECTION 61%
INTRAVENOUS | Status: AC
  Administered 2017-11-08: 15 mL via ORAL
  Filled 2017-11-08: qty 30

## 2017-11-08 MED ORDER — SODIUM CHLORIDE 0.9 % IV SOLN: 250.0000 mL | INTRAVENOUS | Status: DC | PRN

## 2017-11-08 MED ORDER — SODIUM CHLORIDE 0.9% FLUSH: 3.0000 mL | INTRAVENOUS | Status: DC | PRN

## 2017-11-08 MED ORDER — ENSURE SURGERY PO LIQD
237.0000 mL | Freq: Two times a day (BID) | ORAL | Status: DC
  Administered 2017-11-08: 237 mL via ORAL
  Filled 2017-11-08 (×3): qty 237

## 2017-11-08 MED ORDER — HYDROCORTISONE 1 % EX CREA: 1.0000 "application " | TOPICAL_CREAM | Freq: Three times a day (TID) | CUTANEOUS | Status: DC | PRN

## 2017-11-08 MED ORDER — IOPAMIDOL (ISOVUE-300) INJECTION 61%
INTRAVENOUS | Status: AC
  Administered 2017-11-08: 100 mL
  Filled 2017-11-08: qty 100

## 2017-11-08 MED ORDER — HYDROCORTISONE 2.5 % RE CREA: 1.0000 "application " | TOPICAL_CREAM | Freq: Four times a day (QID) | RECTAL | Status: DC | PRN

## 2017-11-08 MED ORDER — GUAIFENESIN-DM 100-10 MG/5ML PO SYRP: 10.0000 mL | ORAL_SOLUTION | ORAL | Status: DC | PRN

## 2017-11-08 MED ORDER — SACCHAROMYCES BOULARDII 250 MG PO CAPS
250.0000 mg | ORAL_CAPSULE | Freq: Two times a day (BID) | ORAL | Status: DC
  Administered 2017-11-08 (×2): 250 mg via ORAL
  Filled 2017-11-08 (×2): qty 1

## 2017-11-08 MED ORDER — ALUM & MAG HYDROXIDE-SIMETH 200-200-20 MG/5ML PO SUSP: 30.0000 mL | Freq: Four times a day (QID) | ORAL | Status: DC | PRN

## 2017-11-08 MED ORDER — POTASSIUM CHLORIDE CRYS ER 20 MEQ PO TBCR
60.0000 meq | EXTENDED_RELEASE_TABLET | Freq: Once | ORAL | Status: AC
  Administered 2017-11-08: 60 meq via ORAL
  Filled 2017-11-08: qty 3

## 2017-11-08 MED ORDER — METOCLOPRAMIDE HCL 5 MG/ML IJ SOLN: 5.0000 mg | Freq: Four times a day (QID) | INTRAMUSCULAR | Status: DC | PRN

## 2017-11-08 MED ORDER — DIPHENHYDRAMINE HCL 25 MG PO CAPS: 25.0000 mg | ORAL_CAPSULE | Freq: Four times a day (QID) | ORAL | Status: DC | PRN

## 2017-11-08 MED ORDER — BISACODYL 10 MG RE SUPP: 10.0000 mg | Freq: Two times a day (BID) | RECTAL | Status: DC | PRN

## 2017-11-08 MED ORDER — PSYLLIUM 95 % PO PACK
1.0000 | PACK | Freq: Every day | ORAL | Status: DC
  Filled 2017-11-08 (×2): qty 1

## 2017-11-08 MED ORDER — PHENOL 1.4 % MT LIQD: 1.0000 | OROMUCOSAL | Status: DC | PRN

## 2017-11-08 MED ORDER — DIPHENHYDRAMINE HCL 50 MG/ML IJ SOLN: 12.5000 mg | Freq: Four times a day (QID) | INTRAMUSCULAR | Status: DC | PRN

## 2017-11-08 NOTE — Progress Notes (Signed)
[facility]  [address] [place], [place] [address] Phone: [phone]  FAX: [phone]      Zeplin Aleshire [id number] 1961/05/24  CARE TEAM:  PCP: Patient, No Pcp Per  Outpatient Care Team: Patient Care Team: Myrlene Broker, MD as PCP - General (Family Medicine) Renelda Mom, MD as Consulting Physician (General Surgery) Kyung Rudd, MD as Consulting Physician (Radiation Oncology) Ladell Pier, MD as Consulting Physician (Oncology) Pyrtle, Lajuan Lines, MD as Consulting Physician (Gastroenterology)  Inpatient Treatment Team: Treatment Team: Attending Provider: Aline August, MD; Consulting Physician: Ladell Pier, MD; Rounding Team: Redmond Baseman, MD; Consulting Physician: Nolon Nations, MD; Registered Nurse: Lolita Rieger, RN; Technician: Virgia Land, [place]; Technician: Lucila Maine, NT; Registered Nurse: Barrington Ellison, RN; Technician: Rutherford Guys, NT   Problem List:   Principal Problem:   Diverticulitis of colon with perforation Active Problems:   Rectal adenocarcinoma ([place])   Leukocytosis      * No surgery found *     Assessment  Recovering from perforated proximal sigmoid diverticulitis  Diverticulitis with perforation -no prior h/o abdominal surgery, last colonoscopy 10/03/17 showed tumor in mid-rectum as well as mild diverticulosis in the sigmoid colon and in the ascending colon - CT scan showed sigmoid diverticulitis with focally contained diverticular perforation, no abscess Plan:  Repeat CT scan on Sunday, 11/09/2017, 4 days after initial CAT scan.  If improved and no evidence of abscess, can transition to outpatient oral antibiotics and follow-up for continuation/resumption of neoadjuvant chemoradiation therapy.  If evidence of abscess, plan percutaneous drainage of abscess by interventional radiology to better control disease.  Wish to be more conservative/aggressive evaluation given the fact  he will soon need chemotherapy and will be increasingly vulnerable to fighting infection.  I would hold off on any aggressive bowel regimen at this time.  Could make his perforation worse.  ID -zosyn 1/16>>day#3 VTE -SCDs, lovenox FEN -IVF,Solid diet Foley -none Rectal adenocarcinoma T3N0 by MRI.  Mid-rectum- he had begun neoadjuvant chemotherapy with Xeloda and radiation followed by Dr. Moody and Dr. Sherrill, planning to see Dr. Ashburn at [facility] for surgery - Xeloda on hold per Dr. Sherrill until tolerating a die  Follow up - medical and radiation oncology, Dr. Ashburn at [facility]     20  minutes spent in review, evaluation, examination, counseling, and coordination of care.  More than 50% of that time was spent in counseling.  Adin Hector, M.D., F.A.C.S. Gastrointestinal and Minimally Invasive Surgery [facility] [address] [place], [place] [address] [phone] Main / Paging   11/08/2017    Subjective: (Chief complaint)  Feeling better.  Walked in hallways.  Having some flatus but no bowel movements.  Wonders if he can have a laxative.  Objective:  Vital signs:  Vitals:   11/07/17 0609 11/07/17 1457 11/07/17 2045 11/08/17 0526  BP: 115/70 119/61 136/70 125/67  Pulse: 66 65 72 68  Resp: 16 17 18 17   Temp: 98.3 F (36.8 C) 98.3 F (36.8 C) 98 F (36.7 C) 99.1 F (37.3 C)  TempSrc: Oral Oral Oral Oral  SpO2: 97% 96% 93% 94%    Last BM Date: 11/04/17  Intake/Output   Yesterday:  01/18 0701 - 01/19 0700 In: 480 [P.O.:480] Out: -  This shift:  Total I/O In: 240 [P.O.:240] Out: -   Bowel function:  Flatus: YES  BM:  No  Drain: (No drain)   Physical Exam:  General: Pt awake/alert/oriented x4 in no acute distress Eyes: PERRL, normal EOM.  Sclera clear.  No icterus Neuro: CN II-XII intact w/o focal sensory/motor deficits. Lymph: No head/neck/groin lymphadenopathy Psych:  No  delerium/psychosis/paranoia HENT: Normocephalic, Mucus membranes moist.  No thrush Neck: Supple, No tracheal deviation Chest: No chest wall pain w good excursion CV:  Pulses intact.  Regular rhythm MS: Normal AROM mjr joints.  No obvious deformity  Abdomen: Soft.  Nondistended.  Tenderness at Left lower quadrant very mild.  No peritonitis.  Improved from initial impression.  No evidence of peritonitis.  No incarcerated hernias.  Ext:  No deformity.  No mjr edema.  No cyanosis Skin: No petechiae / purpura  Results:   Labs: Results for orders placed or performed during the hospital encounter of 11/04/17 (from the past 48 hour(s))  CBC     Status: Abnormal   Collection Time: 11/07/17  5:57 AM  Result Value Ref Range   WBC 5.9 4.0 - 10.5 K/uL   RBC 4.03 (L) 4.22 - 5.81 MIL/uL   Hemoglobin 11.6 (L) 13.0 - 17.0 g/dL   HCT 35.0 (L) 39.0 - 52.0 %   MCV 86.8 78.0 - 100.0 fL   MCH 28.8 26.0 - 34.0 pg   MCHC 33.1 30.0 - 36.0 g/dL   RDW 12.7 11.5 - 15.5 %   Platelets 204 150 - 400 K/uL  Basic metabolic panel     Status: Abnormal   Collection Time: 11/07/17  5:57 AM  Result Value Ref Range   Sodium 139 135 - 145 mmol/L   Potassium 3.5 3.5 - 5.1 mmol/L   Chloride 105 101 - 111 mmol/L   CO2 29 22 - 32 mmol/L   Glucose, Bld 104 (H) 65 - 99 mg/dL   BUN 13 6 - 20 mg/dL   Creatinine, Ser 0.96 0.61 - 1.24 mg/dL   Calcium 8.5 (L) 8.9 - 10.3 mg/dL   GFR calc non Af Amer >60 >60 mL/min   GFR calc Af Amer >60 >60 mL/min    Comment: (NOTE) The eGFR has been calculated using the CKD EPI equation. This calculation has not been validated in all clinical situations. eGFR's persistently <60 mL/min signify possible Chronic Kidney Disease.    Anion gap 5 5 - 15  Magnesium     Status: None   Collection Time: 11/07/17  5:57 AM  Result Value Ref Range   Magnesium 2.0 1.7 - 2.4 mg/dL  CBC with Differential/Platelet     Status: Abnormal   Collection Time: 11/08/17  6:36 AM  Result Value Ref Range    WBC 6.2 4.0 - 10.5 K/uL   RBC 4.09 (L) 4.22 - 5.81 MIL/uL   Hemoglobin 11.9 (L) 13.0 - 17.0 g/dL   HCT 35.3 (L) 39.0 - 52.0 %   MCV 86.3 78.0 - 100.0 fL   MCH 29.1 26.0 - 34.0 pg   MCHC 33.7 30.0 - 36.0 g/dL   RDW 12.6 11.5 - 15.5 %   Platelets 237 150 - 400 K/uL   Neutrophils Relative % 76 %   Neutro Abs 4.7 1.7 - 7.7 K/uL   Lymphocytes Relative 12 %   Lymphs Abs 0.7 0.7 - 4.0 K/uL   Monocytes Relative 9 %   Monocytes Absolute 0.6 0.1 - 1.0 K/uL   Eosinophils Relative 3 %   Eosinophils Absolute 0.2 0.0 - 0.7 K/uL   Basophils Relative 0 %   Basophils Absolute 0.0 0.0 - 0.1 K/uL  Basic metabolic panel  Status: Abnormal   Collection Time: 11/08/17  6:36 AM  Result Value Ref Range   Sodium 137 135 - 145 mmol/L   Potassium 3.4 (L) 3.5 - 5.1 mmol/L   Chloride 101 101 - 111 mmol/L   CO2 28 22 - 32 mmol/L   Glucose, Bld 104 (H) 65 - 99 mg/dL   BUN 9 6 - 20 mg/dL   Creatinine, Ser 1.06 0.61 - 1.24 mg/dL   Calcium 8.7 (L) 8.9 - 10.3 mg/dL   GFR calc non Af Amer >60 >60 mL/min   GFR calc Af Amer >60 >60 mL/min    Comment: (NOTE) The eGFR has been calculated using the CKD EPI equation. This calculation has not been validated in all clinical situations. eGFR's persistently <60 mL/min signify possible Chronic Kidney Disease.    Anion gap 8 5 - 15  Magnesium     Status: None   Collection Time: 11/08/17  6:36 AM  Result Value Ref Range   Magnesium 2.0 1.7 - 2.4 mg/dL    Imaging / Studies: No results found.  Medications / Allergies: per chart  Antibiotics: Anti-infectives (From admission, onward)   Start     Dose/Rate Route Frequency Ordered Stop   11/05/17 0600  piperacillin-tazobactam (ZOSYN) IVPB 3.375 g     3.375 g 12.5 mL/hr over 240 Minutes Intravenous Every 8 hours 11/05/17 0504     11/05/17 0030  piperacillin-tazobactam (ZOSYN) IVPB 3.375 g     3.375 g 100 mL/hr over 30 Minutes Intravenous  Once 11/05/17 0023 11/05/17 0109        Note: Portions of this  report may have been transcribed using voice recognition software. Every effort was made to ensure accuracy; however, inadvertent computerized transcription errors may be present.   Any transcriptional errors that result from this process are unintentional.     Adin Hector, M.D., F.A.C.S. Gastrointestinal and Minimally Invasive Surgery [facility] [address], [place], [place] [address] [phone] Main / Paging   11/08/2017

## 2017-11-08 NOTE — Progress Notes (Signed)
Patient ID: Jeffrey Nolan, male   DOB: Jan 22, 1961, 57 y.o.   MRN: [id number]  PROGRESS NOTE    Jeffrey Nolan  KVQ:[id number] DOB: Feb 18, 1961 DOA: 11/04/2017 PCP: Patient, No Pcp Per   Brief Narrative:  57 year old male with history of adenocarcinoma of the rectum currently on radiation and chemotherapy presented with abdominal pain.  He was admitted with acute diverticulitis with contained perforation and started on intravenous antibiotics.  General surgery and oncology were consulted.   Assessment & Plan:   Principal Problem:   Diverticulitis of colon with perforation Active Problems:   Rectal adenocarcinoma ([place])   Leukocytosis  Acute diverticulitis with contained perforation -Continue Zosyn.  Cultures negative so far -General surgery following.  Diet as per general surgery  -Patient come complains of mild lower abdominal pain with constipation today.  Will defer to general surgery for stool softener/laxative use because of perforation -Continue pain management -Repeat a.m. labs  Leukocytosis:  -Probably secondary to above.  Resolved  Rectal adenocarcinoma: Patient on oral medications Xeloda and followed by Dr. Benay Spice of oncology.  Also currently receiving radiation therapy followed by Dr. Lisbeth Renshaw of radiation oncology. - Oncology evaluation appreciated.  Chemotherapy and radiation to be on hold for now until he is tolerating a diet.  Hypokalemia -Replace.  Repeat a.m. labs   DVT prophylaxis: Lovenox Code Status: Full Family Communication: None at bedside Disposition Plan: Depends on clinical outcome  Consultants: General surgery, oncology  Procedures: None  Antimicrobials: Zosyn from 11/04/2017 onwards   Subjective: Patient seen and examined at bedside.  He complains of lower abdominal pain today and constipation.  No overnight fever or vomiting.  He is passing gas.  Objective: Vitals:   11/07/17 0609 11/07/17 1457 11/07/17 2045 11/08/17 0526  BP: 115/70  119/61 136/70 125/67  Pulse: 66 65 72 68  Resp: 16 17 18 17   Temp: 98.3 F (36.8 C) 98.3 F (36.8 C) 98 F (36.7 C) 99.1 F (37.3 C)  TempSrc: Oral Oral Oral Oral  SpO2: 97% 96% 93% 94%    Intake/Output Summary (Last 24 hours) at 11/08/2017 0857 Last data filed at 11/08/2017 0805 Gross per 24 hour  Intake 720 ml  Output -  Net 720 ml   There were no vitals filed for this visit.  Examination:  General exam: Appears calm and comfortable. Respiratory system: Bilateral decreased breath sound at bases Cardiovascular system: S1 & S2 heard, controlled  gastrointestinal system: Abdomen is nondistended, soft and mildly tender in the lower quadrant. Normal bowel sounds heard. Extremities: No cyanosis, clubbing, edema    Data Reviewed: I have personally reviewed following labs and imaging studies  CBC: Recent Labs  Lab 11/04/17 2227 11/05/17 0522 11/06/17 0627 11/07/17 0557 11/08/17 0636  WBC 11.3* 10.6* 8.2 5.9 6.2  NEUTROABS 9.4*  --  6.6  --  4.7  HGB 13.0 12.3* 12.1* 11.6* 11.9*  HCT 39.2 36.4* 36.3* 35.0* 35.3*  MCV 86.3 86.1 86.6 86.8 86.3  PLT 229 196 224 204 643   Basic Metabolic Panel: Recent Labs  Lab 11/04/17 2227 11/05/17 0522 11/06/17 0627 11/07/17 0557 11/08/17 0636  NA 137 135 138 139 137  K 3.8 3.7 3.8 3.5 3.4*  CL 103 101 103 105 101  CO2 26 26 28 29 28   GLUCOSE 106* 119* 100* 104* 104*  BUN 19 14 16 13 9   CREATININE 1.01 0.99 0.94 0.96 1.06  CALCIUM 8.7* 8.4* 8.6* 8.5* 8.7*  MG  --   --  2.0 2.0 2.0  GFR: CrCl cannot be calculated (Unknown ideal weight.). Liver Function Tests: Recent Labs  Lab 11/04/17 2227  AST 21  ALT 24  ALKPHOS 65  BILITOT 1.0  PROT 6.8  ALBUMIN 3.7   Recent Labs  Lab 11/04/17 2227  LIPASE 22   No results for input(s): AMMONIA in the last 168 hours. Coagulation Profile: No results for input(s): INR, PROTIME in the last 168 hours. Cardiac Enzymes: No results for input(s): CKTOTAL, CKMB, CKMBINDEX,  TROPONINI in the last 168 hours. BNP (last 3 results) No results for input(s): PROBNP in the last 8760 hours. HbA1C: No results for input(s): HGBA1C in the last 72 hours. CBG: No results for input(s): GLUCAP in the last 168 hours. Lipid Profile: No results for input(s): CHOL, HDL, LDLCALC, TRIG, CHOLHDL, LDLDIRECT in the last 72 hours. Thyroid Function Tests: No results for input(s): TSH, T4TOTAL, FREET4, T3FREE, THYROIDAB in the last 72 hours. Anemia Panel: No results for input(s): VITAMINB12, FOLATE, FERRITIN, TIBC, IRON, RETICCTPCT in the last 72 hours. Sepsis Labs: Recent Labs  Lab 11/04/17 2231  LATICACIDVEN 0.99    Recent Results (from the past 240 hour(s))  Culture, blood (routine x 2)     Status: None (Preliminary result)   Collection Time: 11/04/17 10:26 PM  Result Value Ref Range Status   Specimen Description BLOOD RIGHT HAND  Final   Special Requests   Final    BOTTLES DRAWN AEROBIC AND ANAEROBIC Blood Culture adequate volume   Culture   Final    NO GROWTH 2 DAYS Performed at [facility], [place] [address] [place], [place] [address]    Report Status PENDING  Incomplete  Culture, blood (routine x 2)     Status: None (Preliminary result)   Collection Time: 11/04/17 10:50 PM  Result Value Ref Range Status   Specimen Description BLOOD  Final   Special Requests NONE  Final   Culture   Final    NO GROWTH 2 DAYS Performed at [facility], [place] [address] [place], [place] [address]    Report Status PENDING  Incomplete         Radiology Studies: No results found.      Scheduled Meds: . enoxaparin (LOVENOX) injection  40 mg Subcutaneous Q24H  . potassium chloride  60 mEq Oral Once   Continuous Infusions: . sodium chloride 50 mL/hr at 11/07/17 1141  . piperacillin-tazobactam (ZOSYN)  IV 3.375 g (11/08/17 0618)     LOS: 3 days        Aline August, MD [facility] Pager [phone]  If 7PM-7AM, please contact  night-coverage [email or web address] Password [facility] 11/08/2017, 8:57 AM

## 2017-11-09 ENCOUNTER — Encounter (HOSPITAL_COMMUNITY): Payer: Self-pay

## 2017-11-09 DIAGNOSIS — K5720 Diverticulitis of large intestine with perforation and abscess without bleeding: Secondary | ICD-10-CM | POA: Diagnosis not present

## 2017-11-09 LAB — CBC WITH DIFFERENTIAL/PLATELET
BASOS ABS: 0 10*3/uL (ref 0.0–0.1)
BASOS PCT: 0 %
EOS PCT: 4 %
Eosinophils Absolute: 0.2 10*3/uL (ref 0.0–0.7)
HEMATOCRIT: 36.1 % — AB (ref 39.0–52.0)
Hemoglobin: 12.2 g/dL — ABNORMAL LOW (ref 13.0–17.0)
Lymphocytes Relative: 15 %
Lymphs Abs: 0.8 10*3/uL (ref 0.7–4.0)
MCH: 29.1 pg (ref 26.0–34.0)
MCHC: 33.8 g/dL (ref 30.0–36.0)
MCV: 86.2 fL (ref 78.0–100.0)
MONO ABS: 0.8 10*3/uL (ref 0.1–1.0)
Monocytes Relative: 14 %
NEUTROS ABS: 3.9 10*3/uL (ref 1.7–7.7)
Neutrophils Relative %: 67 %
PLATELETS: 236 10*3/uL (ref 150–400)
RBC: 4.19 MIL/uL — ABNORMAL LOW (ref 4.22–5.81)
RDW: 12.7 % (ref 11.5–15.5)
WBC: 5.7 10*3/uL (ref 4.0–10.5)

## 2017-11-09 LAB — BASIC METABOLIC PANEL
ANION GAP: 6 (ref 5–15)
BUN: 15 mg/dL (ref 6–20)
CALCIUM: 9.1 mg/dL (ref 8.9–10.3)
CO2: 28 mmol/L (ref 22–32)
Chloride: 105 mmol/L (ref 101–111)
Creatinine, Ser: 1.03 mg/dL (ref 0.61–1.24)
GLUCOSE: 97 mg/dL (ref 65–99)
Potassium: 3.8 mmol/L (ref 3.5–5.1)
Sodium: 139 mmol/L (ref 135–145)

## 2017-11-09 LAB — MAGNESIUM: Magnesium: 2.1 mg/dL (ref 1.7–2.4)

## 2017-11-09 MED ORDER — AMOXICILLIN-POT CLAVULANATE 875-125 MG PO TABS: 1.0000 | ORAL_TABLET | Freq: Two times a day (BID) | ORAL | 0 refills | Status: AC

## 2017-11-09 MED ORDER — HYDROCORTISONE 2.5 % RE CREA: 1.0000 "application " | TOPICAL_CREAM | Freq: Four times a day (QID) | RECTAL | 0 refills | Status: DC | PRN

## 2017-11-09 MED ORDER — OXYCODONE-ACETAMINOPHEN 5-325 MG PO TABS: 1.0000 | ORAL_TABLET | Freq: Four times a day (QID) | ORAL | 0 refills | Status: DC | PRN

## 2017-11-09 MED ORDER — HYDROCORTISONE 1 % EX CREA: 1.0000 "application " | TOPICAL_CREAM | Freq: Three times a day (TID) | CUTANEOUS | 0 refills | Status: DC | PRN

## 2017-11-09 MED ORDER — PSYLLIUM 95 % PO PACK: 1.0000 | PACK | Freq: Every day | ORAL | 0 refills | Status: DC | PRN

## 2017-11-09 NOTE — Discharge Summary (Signed)
Physician Discharge Summary  Jeffrey Nolan POE:[id number] DOB: [birth year] DOA: 11/04/2017  PCP: Jeffrey Broker, MD  Admit date: 11/04/2017 Discharge date: 11/09/2017  Admitted From: Home  disposition: Home  Recommendations for Outpatient Follow-up:  1. Follow up with PCP in 1 week with repeat CBC/BMP 2. Follow-up with general surgery locally or with general surgeon at [facility] within a week 3. Follow-up with Dr. Sherill/oncology as scheduled 4. Follow-up with radiation oncology as scheduled  Home Health: No Equipment/Devices: None  Discharge Condition: Stable CODE STATUS: Full Diet recommendation: Soft diet  Brief/Interim Summary: 57 year old male with history of adenocarcinoma of the rectum currently on radiation and chemotherapy presented with abdominal pain.  He was admitted with acute diverticulitis with contained perforation and started on intravenous antibiotics.  General surgery and oncology were consulted.  Patient tolerated antibiotics.  Diet was started by general surgery and gradually advanced.  Patient is tolerating diet.  Repeat CT scan done on 11/08/2017 did not show worsening of abscess.  General surgery has cleared the patient for discharge on oral antibiotics.   Discharge Diagnoses:  Principal Problem:   Diverticulitis of colon with perforation Active Problems:   Rectal adenocarcinoma -mid rectum [id number]   Leukocytosis  Acute diverticulitis with contained perforation -Currently on Zosyn.  Cultures negative so far -General surgery following.  Diet advanced as per general surgery.  -Repeat CAT scan done on 11/08/2017 as per general surgery showed stable sigmoid diverticulitis  with abscess and contained perforation without evidence of abscess.  I spoke to Dr. Olena Mater surgery today on phone and he is okay for the patient to be discharged home with outpatient follow-up with general surgery locally and/or at [facility] depending  on patient's preference within a week. -Patient will be discharged on 2 weeks of Augmentin -Soft diet -Patient follow-up with primary care provider as well  Leukocytosis: -Probably secondary to above.  Resolved  Rectal adenocarcinoma:Patient on oral medicationsXeloda andfollowed by Dr.Sherrill ofoncology.Also currently receiving radiation therapy followed by Dr. Lisbeth Renshaw of radiation oncology. -Oncology evaluation appreciated.   -Outpatient follow-up with oncology and radiation oncology for continuation of chemotherapy and radiation  Hypokalemia -Resolved    Discharge Instructions  Discharge Instructions    Call MD for:  difficulty breathing, headache or visual disturbances   Complete by:  As directed    Call MD for:  extreme fatigue   Complete by:  As directed    Call MD for:  hives   Complete by:  As directed    Call MD for:  persistant dizziness or light-headedness   Complete by:  As directed    Call MD for:  persistant nausea and vomiting   Complete by:  As directed    Call MD for:  severe uncontrolled pain   Complete by:  As directed    Call MD for:  temperature >100.4   Complete by:  As directed    Diet - low sodium heart healthy   Complete by:  As directed    Discharge instructions   Complete by:  As directed    Soft diet   Increase activity slowly   Complete by:  As directed      Allergies as of 11/09/2017   No Known Allergies     Medication List    TAKE these medications   amoxicillin-clavulanate 875-125 MG tablet Commonly known as:  AUGMENTIN Take 1 tablet by mouth every 12 (twelve) hours for 14 days.   capecitabine 500 MG tablet Commonly known  as:  XELODA Take 4 tablets (2,000 mg total) by mouth 2 (two) times daily after a meal. Take on days of radiation Mon-Fri only.   hydrocortisone 2.5 % rectal cream Commonly known as:  ANUSOL-HC Apply 1 application topically 4 (four) times daily as needed for hemorrhoids.   hydrocortisone cream 1  % Apply 1 application topically 3 (three) times daily as needed for itching (minor skin irritation).   oxyCODONE-acetaminophen 5-325 MG tablet Commonly known as:  PERCOCET/ROXICET Take 1 tablet by mouth every 6 (six) hours as needed for moderate pain.   prochlorperazine 10 MG tablet Commonly known as:  COMPAZINE Take 1 tablet (10 mg total) by mouth every 6 (six) hours as needed for nausea or vomiting.   psyllium 95 % Pack Commonly known as:  HYDROCIL/METAMUCIL Take 1 packet by mouth daily as needed for mild constipation.   sildenafil 20 MG tablet Commonly known as:  REVATIO TAKE 3 TABLETS BY MOUTH AS DIRECTED      Follow-up Information    Jeffrey Broker, MD. Schedule an appointment as soon as possible for a visit in 1 week(s).   Specialty:  Family Medicine Why:  with repeat cbc/bmp Contact information: [place] [address] [phone]        Ladell Pier, MD. Schedule an appointment as soon as possible for a visit in 1 week(s).   Specialty:  Oncology Contact information: [place] [address] [phone]        Michael Boston, MD. Schedule an appointment as soon as possible for a visit in 1 week(s).   Specialty:  General Surgery Why:  if can't get earlier appointment with surgeon at [place] information: [place] [address] [phone]          No Known Allergies  Consultations:  General surgery and oncology   Procedures/Studies: Dg Chest 2 View  Result Date: 11/04/2017 CLINICAL DATA:  57 year old male with cough and fever. EXAM: CHEST  2 VIEW COMPARISON:  Chest CT dated 10/08/2017 FINDINGS: The heart size and mediastinal contours are within normal limits. Both lungs are clear. The visualized skeletal structures are unremarkable. IMPRESSION: No active cardiopulmonary disease. Electronically Signed   By: Anner Crete M.D.   On: 11/04/2017 22:17   Mr Jeffrey W  Nolan Contrast  Result Date: 10/20/2017 CLINICAL DATA:  Rectal adenocarcinoma, for staging. Evaluate liver lesions. EXAM: MRI ABDOMEN AND Jeffrey WITHOUT AND WITH CONTRAST TECHNIQUE: Multiplanar multisequence MR imaging of the abdomen and Jeffrey was performed both before and after the administration of intravenous contrast. Small amount of [place] gel was administered per rectum to optimize tumor evaluation. CONTRAST:  48mL MULTIHANCE GADOBENATE DIMEGLUMINE 529 MG/ML IV SOLN COMPARISON:  CT abdomen/ Jeffrey dated 10/08/2017. FINDINGS: Motion degraded images. Lower chest: Lung bases are clear. Hepatobiliary: 10 mm cyst in segment 4A (series 16/ image 23), benign. Additional scattered cysts measuring up to 6 mm in segment 7 (series 11/image 25). No suspicious/enhancing hepatic lesions. Gallbladder is unremarkable. No intrahepatic or extrahepatic duct dilatation. Pancreas:  Within normal limits. Spleen:  Within normal limits. Adrenals/Urinary Tract:  Adrenal glands are within normal limits. Scarring along the lateral left upper kidney. Two left renal cysts measuring 12 mm. The mildly irregular cyst adjacent to the scarring (series 13/ image 44) noted on CT does not demonstrate enhancement following contrast administration, and is compatible with a benign Bosniak II cyst. Right kidney is within normal limits.  No hydronephrosis.  Stomach/Bowel: Stomach is within normal limits. No evidence of bowel obstruction. 16 x 10 x 17 mm eccentric polypoid rectal mass in the 10-11 o'clock position (series 5/image 30). Vascular/Lymphatic:  No evidence of abdominal aortic aneurysm. No suspicious abdominopelvic lymphadenopathy. Reproductive: Prostate is within normal limits. Other:  No abdominopelvic ascites. Musculoskeletal: No focal osseous lesions. --- RECTAL CANCER STAGING: TUMOR LOCATION Location from Anal Verge:  Mid 5-10cm Shortest Distance from Tumor to Anal Sphincter:  9 cm TUMOR DESCRIPTION Circumferential Extent: Eccentric,  polypoid rectal mass in the 10-11 o'clock position Craniocaudal Extent:  1.7 cm T - CATEGORY Extension through Muscularis Propria:  Yes = T3 Maximum extension beyond Muscularis Propria:  4 mm (early T3 = <85mm) Extramural vascular invasion/tumor thrombus:  No Shortest distance of any tumor/node from Mesorectal Fascia:  13 mm Invasion of Anterior Peritoneal Reflection:  No Involvement of Adjacent Organs or Pelvic Sidewall Structures:  No N - CATEGORY Mesorectal Lymph Nodes >=61mm:  No = N0 Extra-mesorectal Lymphadenopathy:  No Other: None IMPRESSION: 1.6 x 1.0 x 1.7 cm eccentric polypoid rectal mass in the 10-11 o'clock position of the mid rectum. Mass is located 9 cm from the anal verge. Staging by MRI is early T3, N0, M0, as described above. Scattered hepatic cysts measuring up to 10 mm in segment 4A. No findings suspicious for hepatic metastases. Two left upper pole renal cyst measuring up to 12 mm, benign (Bosniak I-II). Electronically Signed   By: Julian Hy M.D.   On: 10/20/2017 11:28   Mr Abdomen W Nolan Contrast  Result Date: 10/20/2017 CLINICAL DATA:  Rectal adenocarcinoma, for staging. Evaluate liver lesions. EXAM: MRI ABDOMEN AND Jeffrey WITHOUT AND WITH CONTRAST TECHNIQUE: Multiplanar multisequence MR imaging of the abdomen and Jeffrey was performed both before and after the administration of intravenous contrast. Small amount of [place] gel was administered per rectum to optimize tumor evaluation. CONTRAST:  43mL MULTIHANCE GADOBENATE DIMEGLUMINE 529 MG/ML IV SOLN COMPARISON:  CT abdomen/ Jeffrey dated 10/08/2017. FINDINGS: Motion degraded images. Lower chest: Lung bases are clear. Hepatobiliary: 10 mm cyst in segment 4A (series 16/ image 23), benign. Additional scattered cysts measuring up to 6 mm in segment 7 (series 11/image 25). No suspicious/enhancing hepatic lesions. Gallbladder is unremarkable. No intrahepatic or extrahepatic duct dilatation. Pancreas:  Within normal limits. Spleen:  Within normal  limits. Adrenals/Urinary Tract:  Adrenal glands are within normal limits. Scarring along the lateral left upper kidney. Two left renal cysts measuring 12 mm. The mildly irregular cyst adjacent to the scarring (series 13/ image 44) noted on CT does not demonstrate enhancement following contrast administration, and is compatible with a benign Bosniak II cyst. Right kidney is within normal limits.  No hydronephrosis. Stomach/Bowel: Stomach is within normal limits. No evidence of bowel obstruction. 16 x 10 x 17 mm eccentric polypoid rectal mass in the 10-11 o'clock position (series 5/image 30). Vascular/Lymphatic:  No evidence of abdominal aortic aneurysm. No suspicious abdominopelvic lymphadenopathy. Reproductive: Prostate is within normal limits. Other:  No abdominopelvic ascites. Musculoskeletal: No focal osseous lesions. --- RECTAL CANCER STAGING: TUMOR LOCATION Location from Anal Verge:  Mid 5-10cm Shortest Distance from Tumor to Anal Sphincter:  9 cm TUMOR DESCRIPTION Circumferential Extent: Eccentric, polypoid rectal mass in the 10-11 o'clock position Craniocaudal Extent:  1.7 cm T - CATEGORY Extension through Muscularis Propria:  Yes = T3 Maximum extension beyond Muscularis Propria:  4 mm (early T3 = <7mm) Extramural vascular invasion/tumor thrombus:  No Shortest distance of any tumor/node from Mesorectal Fascia:  13 mm  Invasion of Anterior Peritoneal Reflection:  No Involvement of Adjacent Organs or Pelvic Sidewall Structures:  No N - CATEGORY Mesorectal Lymph Nodes >=77mm:  No = N0 Extra-mesorectal Lymphadenopathy:  No Other: None IMPRESSION: 1.6 x 1.0 x 1.7 cm eccentric polypoid rectal mass in the 10-11 o'clock position of the mid rectum. Mass is located 9 cm from the anal verge. Staging by MRI is early T3, N0, M0, as described above. Scattered hepatic cysts measuring up to 10 mm in segment 4A. No findings suspicious for hepatic metastases. Two left upper pole renal cyst measuring up to 12 mm, benign (Bosniak  I-II). Electronically Signed   By: Julian Hy M.D.   On: 10/20/2017 11:28   Ct Abdomen Jeffrey W Contrast  Result Date: 11/08/2017 CLINICAL DATA:  Perforated proximal sigmoid diverticulitis. History of rectal cancer. EXAM: CT ABDOMEN AND Jeffrey WITH CONTRAST TECHNIQUE: Multidetector CT imaging of the abdomen and Jeffrey was performed using the standard protocol following bolus administration of intravenous contrast. CONTRAST:  163mL ISOVUE-300 IOPAMIDOL (ISOVUE-300) INJECTION 61% COMPARISON:  CT 11/04/2017 and 10/08/2017. FINDINGS: Lower chest: Mild linear atelectasis in the left lower lobe. There is a stable calcified right lower lobe granuloma. The lung bases are otherwise clear. No significant pleural or pericardial effusion. Hepatobiliary: Stable small hypodensities in the liver, characterize as cysts on previous MRI. No acute hepatic findings. No evidence of gallstones, gallbladder wall thickening or biliary dilatation. Pancreas: Unremarkable. No pancreatic ductal dilatation or surrounding inflammatory changes. Spleen: Normal in size without focal abnormality. Adrenals/Urinary Tract: Both adrenal glands appear normal. The right kidney appears normal. There is stable cortical scarring and cyst formation in the upper interpolar region of the left kidney. No evidence of urinary tract calculus or hydronephrosis. The bladder appears unremarkable. There is no air in the bladder lumen. Stomach/Bowel: The stomach, small bowel, appendix and proximal colon appear normal. No significant change in appearance of active sigmoid diverticulitis with wall thickening, surrounding inflammation and a small amount of adjacent extraluminal air consistent with a contained perforation. This measures up to 24 mm on coronal image 48. The enteric contrast has not yet passed through the distal colon. No drainable abscess identified. Known rectal mass not well visualized. Vascular/Lymphatic: There are no enlarged abdominal or  pelvic lymph nodes. Mild aortic atherosclerosis. No acute vascular findings. Reproductive: The prostate gland and seminal vesicles appear stable. Other: Stable tiny umbilical hernia containing only fat. No ascites or free air. Musculoskeletal: No acute or significant osseous findings. Probable old fracture of the left 8th rib, stable. There is lower lumbar spondylosis with a grade 1 anterolisthesis at L4-5. IMPRESSION: 1. No significant change in appearance of sigmoid diverticulitis and adjacent contained perforation. No evidence of drainable abscess, free air or bowel obstruction. 2. Stable incidental findings. 3. No evidence of metastatic disease. Electronically Signed   By: Richardean Sale M.D.   On: 11/08/2017 17:16   Ct Abdomen Jeffrey W Contrast  Result Date: 11/05/2017 CLINICAL DATA:  57 year old male with abdominal pain. History of rectal adenocarcinoma. EXAM: CT ABDOMEN AND Jeffrey WITH CONTRAST TECHNIQUE: Multidetector CT imaging of the abdomen and Jeffrey was performed using the standard protocol following bolus administration of intravenous contrast. CONTRAST:  110mL ISOVUE-300 IOPAMIDOL (ISOVUE-300) INJECTION 61% COMPARISON:  Pelvic MRI dated 10/20/2017 and abdominal CT dated 10/08/2017 FINDINGS: Lower chest: The visualized lung bases are clear. There is a small calcified granuloma in the right posterior costophrenic angle. No intra-abdominal free air or free fluid. Hepatobiliary: Probable mild fatty infiltration of the liver.  Small scattered hepatic hypodense lesions, not characterized on this CT corresponding to the cysts noted the prior MRI. These are stable compared to the CT of 10/08/2017. No intrahepatic biliary ductal dilatation. The gallbladder is unremarkable. Pancreas: Unremarkable. No pancreatic ductal dilatation or surrounding inflammatory changes. Spleen: Normal in size without focal abnormality. Adrenals/Urinary Tract: The adrenal glands are unremarkable. Left renal cysts better seen on  the prior MRI. The kidneys are otherwise unremarkable. There is symmetric enhancement and excretion of contrast by both kidneys. The visualized ureters and urinary bladder appear unremarkable. Stomach/Bowel: There is sigmoid diverticulosis without active inflammatory changes compatible with acute diverticulitis. Focally contained extraluminal air adjacent to the sigmoid colon. No diverticular abscess. No bowel obstruction. The known rectal mass is not evaluated on today's exam. No perirectal adenopathy. Normal appendix. Vascular/Lymphatic: Mild aortoiliac atherosclerotic disease. The abdominal aorta and IVC are otherwise unremarkable. No portal venous gas. There is no adenopathy. Reproductive: The prostate and seminal vesicles are grossly unremarkable. Other: Small fat containing umbilical hernia.  No fluid collection. Musculoskeletal: Grade 1 L4-L5 anterolisthesis. No acute osseous pathology. IMPRESSION: 1. Sigmoid diverticulitis with focally contained diverticular perforation. No abscess. 2. No bowel obstruction.  Normal appendix. 3. Small hepatic and left renal cysts. 4.  Aortic Atherosclerosis (ICD10-I70.0). These results were called by telephone at the time of interpretation on 11/05/2017 at 12:17 am to Dr. Sherwood Gambler , who verbally acknowledged these results. Electronically Signed   By: Anner Crete M.D.   On: 11/05/2017 00:19     Subjective: Patient seen and examined at bedside.  He feels better.  Wants to go home.  No overnight fever, nausea or vomiting.  He had bowel movements overnight and this morning.  Discharge Exam: Vitals:   11/08/17 2123 11/09/17 0534  BP: 124/72 116/65  Pulse: 72 64  Resp: 18 18  Temp: 98.1 F (36.7 C) 97.9 F (36.6 C)  SpO2: 95% 96%   Vitals:   11/08/17 2000 11/08/17 2123 11/09/17 0534 11/09/17 0542  BP:  124/72 116/65   Pulse:  72 64   Resp:  18 18   Temp:  98.1 F (36.7 C) 97.9 F (36.6 C)   TempSrc:  Oral Oral   SpO2:  95% 96%   Weight:    99.6  kg (219 lb 9.3 oz)  Height: 6\' 2"  (1.88 m)   6\' 2"  (1.88 m)    General: Pt is alert, awake, not in acute distress Cardiovascular: Rate controlled, S1/S2 + Respiratory: Bilateral decreased breath sounds at bases Abdominal: Soft, very mildly tender lower quadrant, ND, bowel sounds + Extremities: no edema, no cyanosis    The results of significant diagnostics from this hospitalization (including imaging, microbiology, ancillary and laboratory) are listed below for reference.     Microbiology: Recent Results (from the past 240 hour(s))  Culture, blood (routine x 2)     Status: None (Preliminary result)   Collection Time: 11/04/17 10:26 PM  Result Value Ref Range Status   Specimen Description BLOOD RIGHT HAND  Final   Special Requests   Final    BOTTLES DRAWN AEROBIC AND ANAEROBIC Blood Culture adequate volume   Culture   Final    NO GROWTH 3 DAYS Performed at [facility], [address] [place], [place] [address]    Report Status PENDING  Incomplete  Culture, blood (routine x 2)     Status: None (Preliminary result)   Collection Time: 11/04/17 10:50 PM  Result Value Ref Range Status   Specimen  Description BLOOD  Final   Special Requests NONE  Final   Culture   Final    NO GROWTH 3 DAYS Performed at [facility] [address], [place], [place] [address]    Report Status PENDING  Incomplete     Labs: BNP (last 3 results) No results for input(s): BNP in the last 8760 hours. Basic Metabolic Panel: Recent Labs  Lab 11/05/17 0522 11/06/17 0627 11/07/17 0557 11/08/17 0636 11/09/17 0604  NA 135 138 139 137 139  K 3.7 3.8 3.5 3.4* 3.8  CL 101 103 105 101 105  CO2 26 28 29 28 28   GLUCOSE 119* 100* 104* 104* 97  BUN 14 16 13 9 15   CREATININE 0.99 0.94 0.96 1.06 1.03  CALCIUM 8.4* 8.6* 8.5* 8.7* 9.1  MG  --  2.0 2.0 2.0 2.1   Liver Function Tests: Recent Labs  Lab 11/04/17 2227  AST 21  ALT 24  ALKPHOS 65  BILITOT 1.0  PROT 6.8  ALBUMIN 3.7    Recent Labs  Lab 11/04/17 2227  LIPASE 22   No results for input(s): AMMONIA in the last 168 hours. CBC: Recent Labs  Lab 11/04/17 2227 11/05/17 0522 11/06/17 0627 11/07/17 0557 11/08/17 0636 11/09/17 0604  WBC 11.3* 10.6* 8.2 5.9 6.2 5.7  NEUTROABS 9.4*  --  6.6  --  4.7 3.9  HGB 13.0 12.3* 12.1* 11.6* 11.9* 12.2*  HCT 39.2 36.4* 36.3* 35.0* 35.3* 36.1*  MCV 86.3 86.1 86.6 86.8 86.3 86.2  PLT 229 196 224 204 237 236   Cardiac Enzymes: No results for input(s): CKTOTAL, CKMB, CKMBINDEX, TROPONINI in the last 168 hours. BNP: Invalid input(s): POCBNP CBG: No results for input(s): GLUCAP in the last 168 hours. D-Dimer No results for input(s): DDIMER in the last 72 hours. Hgb A1c No results for input(s): HGBA1C in the last 72 hours. Lipid Profile No results for input(s): CHOL, HDL, LDLCALC, TRIG, CHOLHDL, LDLDIRECT in the last 72 hours. Thyroid function studies No results for input(s): TSH, T4TOTAL, T3FREE, THYROIDAB in the last 72 hours.  Invalid input(s): FREET3 Anemia work up No results for input(s): VITAMINB12, FOLATE, FERRITIN, TIBC, IRON, RETICCTPCT in the last 72 hours. Urinalysis    Component Value Date/Time   COLORURINE YELLOW 11/04/2017 2112   APPEARANCEUR CLEAR 11/04/2017 2112   LABSPEC 1.013 11/04/2017 2112   PHURINE 6.0 11/04/2017 2112   GLUCOSEU NEGATIVE 11/04/2017 2112   HGBUR SMALL (A) 11/04/2017 2112   BILIRUBINUR NEGATIVE 11/04/2017 2112   [place] NEGATIVE 11/04/2017 2112   PROTEINUR NEGATIVE 11/04/2017 2112   NITRITE NEGATIVE 11/04/2017 2112   LEUKOCYTESUR NEGATIVE 11/04/2017 2112   Sepsis Labs Invalid input(s): PROCALCITONIN,  WBC,  LACTICIDVEN Microbiology Recent Results (from the past 240 hour(s))  Culture, blood (routine x 2)     Status: None (Preliminary result)   Collection Time: 11/04/17 10:26 PM  Result Value Ref Range Status   Specimen Description BLOOD RIGHT HAND  Final   Special Requests   Final    BOTTLES DRAWN AEROBIC  AND ANAEROBIC Blood Culture adequate volume   Culture   Final    NO GROWTH 3 DAYS Performed at [facility], [place] [address] [place], [place] [address]    Report Status PENDING  Incomplete  Culture, blood (routine x 2)     Status: None (Preliminary result)   Collection Time: 11/04/17 10:50 PM  Result Value Ref Range Status   Specimen Description BLOOD  Final   Special Requests NONE  Final   Culture   Final    NO GROWTH 3 DAYS Performed at [facility], [place] [address], [place], [place] [address]    Report Status PENDING  Incomplete     Time coordinating discharge: 35 minutes  SIGNED:   Aline August, MD  [facility] 11/09/2017, 10:13 AM Pager: [phone]  If 7PM-7AM, please contact night-coverage [email or web address] Password [email or web address]

## 2017-11-09 NOTE — Progress Notes (Signed)
[name] to be D/C'd Home per MD order.  Discussed prescriptions and follow up appointments with the patient. Prescriptions given to patient, medication list explained in detail. Pt verbalized understanding.  Allergies as of 11/09/2017   No Known Allergies     Medication List    TAKE these medications   amoxicillin-clavulanate 875-125 MG tablet Commonly known as:  AUGMENTIN Take 1 tablet by mouth every 12 (twelve) hours for 14 days.   capecitabine 500 MG tablet Commonly known as:  XELODA Take 4 tablets (2,000 mg total) by mouth 2 (two) times daily after a meal. Take on days of radiation Mon-Fri only.   hydrocortisone 2.5 % rectal cream Commonly known as:  ANUSOL-HC Apply 1 application topically 4 (four) times daily as needed for hemorrhoids.   hydrocortisone cream 1 % Apply 1 application topically 3 (three) times daily as needed for itching (minor skin irritation).   oxyCODONE-acetaminophen 5-325 MG tablet Commonly known as:  PERCOCET/ROXICET Take 1 tablet by mouth every 6 (six) hours as needed for moderate pain.   prochlorperazine 10 MG tablet Commonly known as:  COMPAZINE Take 1 tablet (10 mg total) by mouth every 6 (six) hours as needed for nausea or vomiting.   psyllium 95 % Pack Commonly known as:  HYDROCIL/METAMUCIL Take 1 packet by mouth daily as needed for mild constipation.   sildenafil 20 MG tablet Commonly known as:  REVATIO TAKE 3 TABLETS BY MOUTH AS DIRECTED       Vitals:   11/08/17 2123 11/09/17 0534  BP: 124/72 116/65  Pulse: 72 64  Resp: 18 18  Temp: 98.1 F (36.7 C) 97.9 F (36.6 C)  SpO2: 95% 96%    Skin clean, dry and intact without evidence of skin break down, no evidence of skin tears noted. IV catheter discontinued intact. Site without signs and symptoms of complications. Dressing and pressure applied. Pt denies pain at this time. No complaints noted.  An After Visit Summary was printed and given to the patient. Patient escorted via  [place], and D/C home via private auto.  Haywood Lasso BSN, RN [employer] Phone [phone]

## 2017-11-09 NOTE — Progress Notes (Addendum)
Jeffrey Nolan  04/30/1961 [id number]  Patient Care Team: Myrlene Broker, MD as PCP - General (Family Medicine) Renelda Mom, MD as Consulting Physician (General Surgery) Kyung Rudd, MD as Consulting Physician (Radiation Oncology) Ladell Pier, MD as Consulting Physician (Oncology) Hilarie Fredrickson, Lajuan Lines, MD as Consulting Physician (Gastroenterology)   Assessment  Recovering from perforated proximal sigmoid diverticulitis  Diverticulitis with perforation -no prior h/o abdominal surgery, last colonoscopy 10/03/17 showed tumor in mid-rectum as well as mild diverticulosis in the sigmoid colon and in the ascending colon - CT scan showedsigmoid diverticulitis with focally contained diverticular perforation, no abscess Plan:  Repeat CT scan done last night instead of as requested for today 11/09/2017, 4 days after initial CAT scan.    Diverticulitis stable to slightly improved.  Certainly evidence of abscess, discussed with Dr. Starla Link.  We both agree that he can can transition to outpatient oral antibiotics and follow-up for continuation/resumption of neoadjuvant chemoradiation therapy.    ID -zosyn 1/16>>day#4 VTE -SCDs, lovenox FEN -IVF,Solid diet Foley -none  Rectal adenocarcinoma T3N0 by MRI.  Mid-rectum- he had begun neoadjuvant chemotherapy with Xeloda and radiation followed by Dr. Lisbeth Renshaw and Dr. Benay Spice, planning to see Dr. Drue Flirt at [facility] for surgery- Xeloda on hold per Dr. Benay Spice until tolerating a diet.  Sounds like they are planning to resume it later in the week  Follow up -medical and radiation oncology  Patient needs to follow-up with a surgeon for his diverticulitis.  Sounds like he prefers to follow-up at [facility].  If he wishes to follow-up with [place] instead, let us know.  I would be happy to see the patient to manage his diverticulitis as well as his rectal cancer.  He had questions about diverticulitis and management of the rectal cancer.  Discussed at  length.  He will discuss with his wife and family.  I do not think he realized we can manage rectal cancer in town.  Whenever he wants to do.  Otherwise be evaluated by Dr. Drue Flirt at [facility].       Patient Active Problem List   Diagnosis Date Noted  . Rectal adenocarcinoma -mid rectum rT3rN0 10/15/2017    Priority: High  . Diverticulitis of colon with perforation 11/05/2017  . Leukocytosis 11/05/2017    Past Medical History:  Diagnosis Date  . Rectal adenocarcinoma -mid rectum rT3rN0 10/15/2017    No past surgical history on file.  Social History   Socioeconomic History  . Marital status: Married    Spouse name: Not on file  . Number of children: Not on file  . Years of education: Not on file  . Highest education level: Not on file  Social Needs  . Financial resource strain: Not on file  . Food insecurity - worry: Not on file  . Food insecurity - inability: Not on file  . Transportation needs - medical: Not on file  . Transportation needs - non-medical: Not on file  Occupational History  . Not on file  Tobacco Use  . Smoking status: Former Research scientist (life sciences)  . Smokeless tobacco: Former Network engineer and Sexual Activity  . Alcohol use: Yes    Comment: Rare Beer  . Drug use: No  . Sexual activity: Not on file  Other Topics Concern  . Not on file  Social History Narrative  . Not on file    Family History  Problem Relation Age of Onset  . Bladder Cancer Father   . Lung cancer Father   . Colon cancer Neg  Hx   . Esophageal cancer Neg Hx   . Pancreatic cancer Neg Hx   . Rectal cancer Neg Hx   . Stomach cancer Neg Hx     Current Facility-Administered Medications  Medication Dose Route Frequency Provider Last Rate Last Dose  . 0.9 %  sodium chloride infusion  250 mL Intravenous PRN Michael Boston, MD      . acetaminophen (TYLENOL) tablet 650 mg  650 mg Oral Q6H PRN Norval Morton, MD   650 mg at 11/06/17 1227   Or  . acetaminophen (TYLENOL) suppository 650 mg  650 mg  Rectal Q6H PRN Smith, Rondell A, MD      . albuterol (PROVENTIL) (2.5 MG/3ML) 0.083% nebulizer solution 2.5 mg  2.5 mg Nebulization Q6H PRN Tamala Julian, Rondell A, MD      . alum & mag hydroxide-simeth (MAALOX/MYLANTA) 200-200-20 MG/5ML suspension 30 mL  30 mL Oral Q6H PRN Michael Boston, MD      . bisacodyl (DULCOLAX) suppository 10 mg  10 mg Rectal Q12H PRN Michael Boston, MD      . diphenhydrAMINE (BENADRYL) capsule 25 mg  25 mg Oral Q6H PRN Michael Boston, MD      . diphenhydrAMINE (BENADRYL) injection 12.5-25 mg  12.5-25 mg Intravenous Q6H PRN Michael Boston, MD      . diphenhydrAMINE-zinc acetate (BENADRYL) 2-0.1 % cream   Topical TID PRN Aline August, MD      . enoxaparin (LOVENOX) injection 40 mg  40 mg Subcutaneous Q24H Smith, Rondell A, MD   40 mg at 11/08/17 1211  . feeding supplement (ENSURE SURGERY) liquid 237 mL  237 mL Oral BID BM Michael Boston, MD   237 mL at 11/08/17 1321  . guaiFENesin-dextromethorphan (ROBITUSSIN DM) 100-10 MG/5ML syrup 10 mL  10 mL Oral Q4H PRN Michael Boston, MD      . hydrocortisone (ANUSOL-HC) 2.5 % rectal cream 1 application  1 application Topical QID PRN Michael Boston, MD      . hydrocortisone cream 1 % 1 application  1 application Topical TID PRN Michael Boston, MD      . lactated ringers bolus 1,000 mL  1,000 mL Intravenous Q8H PRN Anays Detore, Remo Lipps, MD      . menthol-cetylpyridinium (CEPACOL) lozenge 3 mg  1 lozenge Oral PRN Michael Boston, MD      . metoCLOPramide (REGLAN) injection 5-10 mg  5-10 mg Intravenous Q6H PRN Michael Boston, MD      . morphine 4 MG/ML injection 2 mg  2 mg Intravenous Q3H PRN Aline August, MD   2 mg at 11/06/17 1007  . ondansetron (ZOFRAN) tablet 4 mg  4 mg Oral Q6H PRN Fuller Plan A, MD       Or  . ondansetron (ZOFRAN) injection 4 mg  4 mg Intravenous Q6H PRN Smith, Rondell A, MD      . oxyCODONE-acetaminophen (PERCOCET/ROXICET) 5-325 MG per tablet 1-2 tablet  1-2 tablet Oral Q6H PRN Aline August, MD   1 tablet at 11/09/17 0203  .  phenol (CHLORASEPTIC) mouth spray 1-2 spray  1-2 spray Mouth/Throat PRN Michael Boston, MD      . piperacillin-tazobactam (ZOSYN) IVPB 3.375 g  3.375 g Intravenous Q8H Norval Morton, MD   Stopped at 11/09/17 0950  . psyllium (HYDROCIL/METAMUCIL) packet 1 packet  1 packet Oral Daily Tiearra Colwell, Remo Lipps, MD      . saccharomyces boulardii (FLORASTOR) capsule 250 mg  250 mg Oral BID Michael Boston, MD   250 mg at 11/08/17 2152  .  sodium chloride flush (NS) 0.9 % injection 3 mL  3 mL Intravenous Gorden Harms, MD   3 mL at 11/08/17 1322  . sodium chloride flush (NS) 0.9 % injection 3 mL  3 mL Intravenous PRN Michael Boston, MD         No Known Allergies  BP 116/65 (BP Location: Right Arm)   Pulse 64   Temp 97.9 F (36.6 C) (Oral)   Resp 18   Ht 6\' 2"  (1.88 m)   Wt 99.6 kg (219 lb 9.3 oz)   SpO2 96%   BMI 28.19 kg/m   Dg Chest 2 View  Result Date: 11/04/2017 CLINICAL DATA:  57 year old male with cough and fever. EXAM: CHEST  2 VIEW COMPARISON:  Chest CT dated 10/08/2017 FINDINGS: The heart size and mediastinal contours are within normal limits. Both lungs are clear. The visualized skeletal structures are unremarkable. IMPRESSION: No active cardiopulmonary disease. Electronically Signed   By: Anner Crete M.D.   On: 11/04/2017 22:17   [name] Wo Contrast  Result Date: 10/20/2017 CLINICAL DATA:  Rectal adenocarcinoma, for staging. Evaluate liver lesions. EXAM: MRI ABDOMEN AND PELVIS WITHOUT AND WITH CONTRAST TECHNIQUE: Multiplanar multisequence MR imaging of the abdomen and pelvis was performed both before and after the administration of intravenous contrast. Small amount of [place] gel was administered per rectum to optimize tumor evaluation. CONTRAST:  69mL MULTIHANCE GADOBENATE DIMEGLUMINE 529 MG/ML IV SOLN COMPARISON:  CT abdomen/ pelvis dated 10/08/2017. FINDINGS: Motion degraded images. Lower chest: Lung bases are clear. Hepatobiliary: 10 mm cyst in segment 4A (series 16/ image 23),  benign. Additional scattered cysts measuring up to 6 mm in segment 7 (series 11/image 25). No suspicious/enhancing hepatic lesions. Gallbladder is unremarkable. No intrahepatic or extrahepatic duct dilatation. Pancreas:  Within normal limits. Spleen:  Within normal limits. Adrenals/Urinary Tract:  Adrenal glands are within normal limits. Scarring along the lateral left upper kidney. Two left renal cysts measuring 12 mm. The mildly irregular cyst adjacent to the scarring (series 13/ image 44) noted on CT does not demonstrate enhancement following contrast administration, and is compatible with a benign Bosniak II cyst. Right kidney is within normal limits.  No hydronephrosis. Stomach/Bowel: Stomach is within normal limits. No evidence of bowel obstruction. 16 x 10 x 17 mm eccentric polypoid rectal mass in the 10-11 o'clock position (series 5/image 30). Vascular/Lymphatic:  No evidence of abdominal aortic aneurysm. No suspicious abdominopelvic lymphadenopathy. Reproductive: Prostate is within normal limits. Other:  No abdominopelvic ascites. Musculoskeletal: No focal osseous lesions. --- RECTAL CANCER STAGING: TUMOR LOCATION Location from Anal Verge:  Mid 5-10cm Shortest Distance from Tumor to Anal Sphincter:  9 cm TUMOR DESCRIPTION Circumferential Extent: Eccentric, polypoid rectal mass in the 10-11 o'clock position Craniocaudal Extent:  1.7 cm T - CATEGORY Extension through Muscularis Propria:  Yes = T3 Maximum extension beyond Muscularis Propria:  4 mm (early T3 = <47mm) Extramural vascular invasion/tumor thrombus:  No Shortest distance of any tumor/node from Mesorectal Fascia:  13 mm Invasion of Anterior Peritoneal Reflection:  No Involvement of Adjacent Organs or Pelvic Sidewall Structures:  No N - CATEGORY Mesorectal Lymph Nodes >=58mm:  No = N0 Extra-mesorectal Lymphadenopathy:  No Other: None IMPRESSION: 1.6 x 1.0 x 1.7 cm eccentric polypoid rectal mass in the 10-11 o'clock position of the mid rectum. Mass is  located 9 cm from the anal verge. Staging by MRI is early T3, N0, M0, as described above. Scattered hepatic cysts measuring up to 10 mm in segment 4A.  No findings suspicious for hepatic metastases. Two left upper pole renal cyst measuring up to 12 mm, benign (Bosniak I-II). Electronically Signed   By: Julian Hy M.D.   On: 10/20/2017 11:28   Mr Abdomen W Wo Contrast  Result Date: 10/20/2017 CLINICAL DATA:  Rectal adenocarcinoma, for staging. Evaluate liver lesions. EXAM: MRI ABDOMEN AND PELVIS WITHOUT AND WITH CONTRAST TECHNIQUE: Multiplanar multisequence MR imaging of the abdomen and pelvis was performed both before and after the administration of intravenous contrast. Small amount of [place] gel was administered per rectum to optimize tumor evaluation. CONTRAST:  81mL MULTIHANCE GADOBENATE DIMEGLUMINE 529 MG/ML IV SOLN COMPARISON:  CT abdomen/ pelvis dated 10/08/2017. FINDINGS: Motion degraded images. Lower chest: Lung bases are clear. Hepatobiliary: 10 mm cyst in segment 4A (series 16/ image 23), benign. Additional scattered cysts measuring up to 6 mm in segment 7 (series 11/image 25). No suspicious/enhancing hepatic lesions. Gallbladder is unremarkable. No intrahepatic or extrahepatic duct dilatation. Pancreas:  Within normal limits. Spleen:  Within normal limits. Adrenals/Urinary Tract:  Adrenal glands are within normal limits. Scarring along the lateral left upper kidney. Two left renal cysts measuring 12 mm. The mildly irregular cyst adjacent to the scarring (series 13/ image 44) noted on CT does not demonstrate enhancement following contrast administration, and is compatible with a benign Bosniak II cyst. Right kidney is within normal limits.  No hydronephrosis. Stomach/Bowel: Stomach is within normal limits. No evidence of bowel obstruction. 16 x 10 x 17 mm eccentric polypoid rectal mass in the 10-11 o'clock position (series 5/image 30). Vascular/Lymphatic:  No evidence of abdominal aortic aneurysm.  No suspicious abdominopelvic lymphadenopathy. Reproductive: Prostate is within normal limits. Other:  No abdominopelvic ascites. Musculoskeletal: No focal osseous lesions. --- RECTAL CANCER STAGING: TUMOR LOCATION Location from Anal Verge:  Mid 5-10cm Shortest Distance from Tumor to Anal Sphincter:  9 cm TUMOR DESCRIPTION Circumferential Extent: Eccentric, polypoid rectal mass in the 10-11 o'clock position Craniocaudal Extent:  1.7 cm T - CATEGORY Extension through Muscularis Propria:  Yes = T3 Maximum extension beyond Muscularis Propria:  4 mm (early T3 = <52mm) Extramural vascular invasion/tumor thrombus:  No Shortest distance of any tumor/node from Mesorectal Fascia:  13 mm Invasion of Anterior Peritoneal Reflection:  No Involvement of Adjacent Organs or Pelvic Sidewall Structures:  No N - CATEGORY Mesorectal Lymph Nodes >=75mm:  No = N0 Extra-mesorectal Lymphadenopathy:  No Other: None IMPRESSION: 1.6 x 1.0 x 1.7 cm eccentric polypoid rectal mass in the 10-11 o'clock position of the mid rectum. Mass is located 9 cm from the anal verge. Staging by MRI is early T3, N0, M0, as described above. Scattered hepatic cysts measuring up to 10 mm in segment 4A. No findings suspicious for hepatic metastases. Two left upper pole renal cyst measuring up to 12 mm, benign (Bosniak I-II). Electronically Signed   By: Julian Hy M.D.   On: 10/20/2017 11:28   Ct Abdomen Pelvis W Contrast  Result Date: 11/08/2017 CLINICAL DATA:  Perforated proximal sigmoid diverticulitis. History of rectal cancer. EXAM: CT ABDOMEN AND PELVIS WITH CONTRAST TECHNIQUE: Multidetector CT imaging of the abdomen and pelvis was performed using the standard protocol following bolus administration of intravenous contrast. CONTRAST:  160mL ISOVUE-300 IOPAMIDOL (ISOVUE-300) INJECTION 61% COMPARISON:  CT 11/04/2017 and 10/08/2017. FINDINGS: Lower chest: Mild linear atelectasis in the left lower lobe. There is a stable calcified right lower lobe  granuloma. The lung bases are otherwise clear. No significant pleural or pericardial effusion. Hepatobiliary: Stable small hypodensities in the liver, characterize  as cysts on previous MRI. No acute hepatic findings. No evidence of gallstones, gallbladder wall thickening or biliary dilatation. Pancreas: Unremarkable. No pancreatic ductal dilatation or surrounding inflammatory changes. Spleen: Normal in size without focal abnormality. Adrenals/Urinary Tract: Both adrenal glands appear normal. The right kidney appears normal. There is stable cortical scarring and cyst formation in the upper interpolar region of the left kidney. No evidence of urinary tract calculus or hydronephrosis. The bladder appears unremarkable. There is no air in the bladder lumen. Stomach/Bowel: The stomach, small bowel, appendix and proximal colon appear normal. No significant change in appearance of active sigmoid diverticulitis with wall thickening, surrounding inflammation and a small amount of adjacent extraluminal air consistent with a contained perforation. This measures up to 24 mm on coronal image 48. The enteric contrast has not yet passed through the distal colon. No drainable abscess identified. Known rectal mass not well visualized. Vascular/Lymphatic: There are no enlarged abdominal or pelvic lymph nodes. Mild aortic atherosclerosis. No acute vascular findings. Reproductive: The prostate gland and seminal vesicles appear stable. Other: Stable tiny umbilical hernia containing only fat. No ascites or free air. Musculoskeletal: No acute or significant osseous findings. Probable old fracture of the left 8th rib, stable. There is lower lumbar spondylosis with a grade 1 anterolisthesis at L4-5. IMPRESSION: 1. No significant change in appearance of sigmoid diverticulitis and adjacent contained perforation. No evidence of drainable abscess, free air or bowel obstruction. 2. Stable incidental findings. 3. No evidence of metastatic disease.  Electronically Signed   By: Richardean Sale M.D.   On: 11/08/2017 17:16   Ct Abdomen Pelvis W Contrast  Result Date: 11/05/2017 CLINICAL DATA:  57 year old male with abdominal pain. History of rectal adenocarcinoma. EXAM: CT ABDOMEN AND PELVIS WITH CONTRAST TECHNIQUE: Multidetector CT imaging of the abdomen and pelvis was performed using the standard protocol following bolus administration of intravenous contrast. CONTRAST:  133mL ISOVUE-300 IOPAMIDOL (ISOVUE-300) INJECTION 61% COMPARISON:  Pelvic MRI dated 10/20/2017 and abdominal CT dated 10/08/2017 FINDINGS: Lower chest: The visualized lung bases are clear. There is a small calcified granuloma in the right posterior costophrenic angle. No intra-abdominal free air or free fluid. Hepatobiliary: Probable mild fatty infiltration of the liver. Small scattered hepatic hypodense lesions, not characterized on this CT corresponding to the cysts noted the prior MRI. These are stable compared to the CT of 10/08/2017. No intrahepatic biliary ductal dilatation. The gallbladder is unremarkable. Pancreas: Unremarkable. No pancreatic ductal dilatation or surrounding inflammatory changes. Spleen: Normal in size without focal abnormality. Adrenals/Urinary Tract: The adrenal glands are unremarkable. Left renal cysts better seen on the prior MRI. The kidneys are otherwise unremarkable. There is symmetric enhancement and excretion of contrast by both kidneys. The visualized ureters and urinary bladder appear unremarkable. Stomach/Bowel: There is sigmoid diverticulosis without active inflammatory changes compatible with acute diverticulitis. Focally contained extraluminal air adjacent to the sigmoid colon. No diverticular abscess. No bowel obstruction. The known rectal mass is not evaluated on today's exam. No perirectal adenopathy. Normal appendix. Vascular/Lymphatic: Mild aortoiliac atherosclerotic disease. The abdominal aorta and IVC are otherwise unremarkable. No portal venous  gas. There is no adenopathy. Reproductive: The prostate and seminal vesicles are grossly unremarkable. Other: Small fat containing umbilical hernia.  No fluid collection. Musculoskeletal: Grade 1 L4-L5 anterolisthesis. No acute osseous pathology. IMPRESSION: 1. Sigmoid diverticulitis with focally contained diverticular perforation. No abscess. 2. No bowel obstruction.  Normal appendix. 3. Small hepatic and left renal cysts. 4.  Aortic Atherosclerosis (ICD10-I70.0). These results were called by telephone at the  time of interpretation on 11/05/2017 at 12:17 am to Dr. Sherwood Gambler , who verbally acknowledged these results. Electronically Signed   By: Anner Crete M.D.   On: 11/05/2017 00:19    Note: This dictation was prepared with Dragon/digital dictation along with [employer]. Any transcriptional errors that result from this process are unintentional.   .Adin Hector, M.D., F.A.C.S. Gastrointestinal and Minimally Invasive Surgery [facility] [address] [place], [place] [address] [phone] Main / Paging  11/09/2017 10:33 AM

## 2017-11-10 ENCOUNTER — Ambulatory Visit
Admission: RE | Admit: 2017-11-10 | Discharge: 2017-11-10 | Disposition: A | Discharge status: Home / Self Care | Payer: Federal, State, Local not specified - PPO | Source: Ambulatory Visit | Attending: Radiation Oncology | Admitting: Radiation Oncology

## 2017-11-10 DIAGNOSIS — Z8052 Family history of malignant neoplasm of bladder: Secondary | ICD-10-CM | POA: Diagnosis not present

## 2017-11-10 DIAGNOSIS — Z87891 Personal history of nicotine dependence: Secondary | ICD-10-CM | POA: Diagnosis not present

## 2017-11-10 DIAGNOSIS — Z791 Long term (current) use of non-steroidal anti-inflammatories (NSAID): Secondary | ICD-10-CM | POA: Diagnosis not present

## 2017-11-10 DIAGNOSIS — Z801 Family history of malignant neoplasm of trachea, bronchus and lung: Secondary | ICD-10-CM | POA: Diagnosis not present

## 2017-11-10 DIAGNOSIS — C20 Malignant neoplasm of rectum: Secondary | ICD-10-CM | POA: Diagnosis not present

## 2017-11-10 DIAGNOSIS — Z79899 Other long term (current) drug therapy: Secondary | ICD-10-CM | POA: Diagnosis not present

## 2017-11-10 DIAGNOSIS — Z510 Encounter for antineoplastic radiation therapy: Secondary | ICD-10-CM | POA: Diagnosis not present

## 2017-11-10 LAB — CULTURE, BLOOD (ROUTINE X 2)
CULTURE: NO GROWTH
CULTURE: NO GROWTH
SPECIAL REQUESTS: ADEQUATE

## 2017-11-11 ENCOUNTER — Ambulatory Visit
Admission: RE | Admit: 2017-11-11 | Discharge: 2017-11-11 | Disposition: A | Discharge status: Home / Self Care | Payer: Federal, State, Local not specified - PPO | Source: Ambulatory Visit | Attending: Radiation Oncology | Admitting: Radiation Oncology

## 2017-11-11 ENCOUNTER — Telehealth: Payer: Self-pay | Admitting: Pharmacist

## 2017-11-11 DIAGNOSIS — C20 Malignant neoplasm of rectum: Secondary | ICD-10-CM | POA: Diagnosis not present

## 2017-11-11 DIAGNOSIS — Z791 Long term (current) use of non-steroidal anti-inflammatories (NSAID): Secondary | ICD-10-CM | POA: Diagnosis not present

## 2017-11-11 DIAGNOSIS — Z87891 Personal history of nicotine dependence: Secondary | ICD-10-CM | POA: Diagnosis not present

## 2017-11-11 DIAGNOSIS — Z510 Encounter for antineoplastic radiation therapy: Secondary | ICD-10-CM | POA: Diagnosis not present

## 2017-11-11 DIAGNOSIS — Z801 Family history of malignant neoplasm of trachea, bronchus and lung: Secondary | ICD-10-CM | POA: Diagnosis not present

## 2017-11-11 DIAGNOSIS — Z8052 Family history of malignant neoplasm of bladder: Secondary | ICD-10-CM | POA: Diagnosis not present

## 2017-11-11 DIAGNOSIS — Z79899 Other long term (current) drug therapy: Secondary | ICD-10-CM | POA: Diagnosis not present

## 2017-11-11 MED ORDER — CAPECITABINE 500 MG PO TABS: 2000.0000 mg | ORAL_TABLET | Freq: Two times a day (BID) | ORAL | 0 refills | Status: DC

## 2017-11-11 NOTE — Telephone Encounter (Signed)
Oral Oncology Pharmacist Encounter  Sent remainder of capecitabine prescription to [facility] 11/11/17 per requirement of patient's insurance.   Demetrius Charity, PharmD PGY2 Oncology Pharmacy Resident  Pharmacy Phone: [phone] 11/11/2017

## 2017-11-12 ENCOUNTER — Ambulatory Visit
Admission: RE | Admit: 2017-11-12 | Discharge: 2017-11-12 | Disposition: A | Discharge status: Home / Self Care | Payer: Federal, State, Local not specified - PPO | Source: Ambulatory Visit | Attending: Radiation Oncology | Admitting: Radiation Oncology

## 2017-11-12 DIAGNOSIS — Z79899 Other long term (current) drug therapy: Secondary | ICD-10-CM | POA: Diagnosis not present

## 2017-11-12 DIAGNOSIS — Z8052 Family history of malignant neoplasm of bladder: Secondary | ICD-10-CM | POA: Diagnosis not present

## 2017-11-12 DIAGNOSIS — Z87891 Personal history of nicotine dependence: Secondary | ICD-10-CM | POA: Diagnosis not present

## 2017-11-12 DIAGNOSIS — Z510 Encounter for antineoplastic radiation therapy: Secondary | ICD-10-CM | POA: Diagnosis not present

## 2017-11-12 DIAGNOSIS — Z791 Long term (current) use of non-steroidal anti-inflammatories (NSAID): Secondary | ICD-10-CM | POA: Diagnosis not present

## 2017-11-12 DIAGNOSIS — Z801 Family history of malignant neoplasm of trachea, bronchus and lung: Secondary | ICD-10-CM | POA: Diagnosis not present

## 2017-11-12 DIAGNOSIS — C20 Malignant neoplasm of rectum: Secondary | ICD-10-CM | POA: Diagnosis not present

## 2017-11-13 ENCOUNTER — Ambulatory Visit
Admission: RE | Admit: 2017-11-13 | Discharge: 2017-11-13 | Disposition: A | Discharge status: Home / Self Care | Payer: Federal, State, Local not specified - PPO | Source: Ambulatory Visit | Attending: Radiation Oncology | Admitting: Radiation Oncology

## 2017-11-13 DIAGNOSIS — Z87891 Personal history of nicotine dependence: Secondary | ICD-10-CM | POA: Diagnosis not present

## 2017-11-13 DIAGNOSIS — C20 Malignant neoplasm of rectum: Secondary | ICD-10-CM | POA: Diagnosis not present

## 2017-11-13 DIAGNOSIS — Z8052 Family history of malignant neoplasm of bladder: Secondary | ICD-10-CM | POA: Diagnosis not present

## 2017-11-13 DIAGNOSIS — Z79899 Other long term (current) drug therapy: Secondary | ICD-10-CM | POA: Diagnosis not present

## 2017-11-13 DIAGNOSIS — Z510 Encounter for antineoplastic radiation therapy: Secondary | ICD-10-CM | POA: Diagnosis not present

## 2017-11-13 DIAGNOSIS — Z791 Long term (current) use of non-steroidal anti-inflammatories (NSAID): Secondary | ICD-10-CM | POA: Diagnosis not present

## 2017-11-13 DIAGNOSIS — Z801 Family history of malignant neoplasm of trachea, bronchus and lung: Secondary | ICD-10-CM | POA: Diagnosis not present

## 2017-11-14 ENCOUNTER — Ambulatory Visit
Admission: RE | Admit: 2017-11-14 | Discharge: 2017-11-14 | Disposition: A | Discharge status: Home / Self Care | Payer: Federal, State, Local not specified - PPO | Source: Ambulatory Visit | Attending: Radiation Oncology | Admitting: Radiation Oncology

## 2017-11-14 DIAGNOSIS — Z8052 Family history of malignant neoplasm of bladder: Secondary | ICD-10-CM | POA: Diagnosis not present

## 2017-11-14 DIAGNOSIS — Z87891 Personal history of nicotine dependence: Secondary | ICD-10-CM | POA: Diagnosis not present

## 2017-11-14 DIAGNOSIS — Z801 Family history of malignant neoplasm of trachea, bronchus and lung: Secondary | ICD-10-CM | POA: Diagnosis not present

## 2017-11-14 DIAGNOSIS — Z79899 Other long term (current) drug therapy: Secondary | ICD-10-CM | POA: Diagnosis not present

## 2017-11-14 DIAGNOSIS — Z510 Encounter for antineoplastic radiation therapy: Secondary | ICD-10-CM | POA: Diagnosis not present

## 2017-11-14 DIAGNOSIS — C20 Malignant neoplasm of rectum: Secondary | ICD-10-CM | POA: Diagnosis not present

## 2017-11-14 DIAGNOSIS — Z791 Long term (current) use of non-steroidal anti-inflammatories (NSAID): Secondary | ICD-10-CM | POA: Diagnosis not present

## 2017-11-17 ENCOUNTER — Ambulatory Visit
Admission: RE | Admit: 2017-11-17 | Discharge: 2017-11-17 | Disposition: A | Discharge status: Home / Self Care | Payer: Federal, State, Local not specified - PPO | Source: Ambulatory Visit | Attending: Radiation Oncology | Admitting: Radiation Oncology

## 2017-11-17 DIAGNOSIS — Z79899 Other long term (current) drug therapy: Secondary | ICD-10-CM | POA: Diagnosis not present

## 2017-11-17 DIAGNOSIS — K5720 Diverticulitis of large intestine with perforation and abscess without bleeding: Secondary | ICD-10-CM

## 2017-11-17 DIAGNOSIS — Z801 Family history of malignant neoplasm of trachea, bronchus and lung: Secondary | ICD-10-CM | POA: Diagnosis not present

## 2017-11-17 DIAGNOSIS — C20 Malignant neoplasm of rectum: Secondary | ICD-10-CM | POA: Diagnosis not present

## 2017-11-17 DIAGNOSIS — Z8052 Family history of malignant neoplasm of bladder: Secondary | ICD-10-CM | POA: Diagnosis not present

## 2017-11-17 DIAGNOSIS — Z791 Long term (current) use of non-steroidal anti-inflammatories (NSAID): Secondary | ICD-10-CM | POA: Diagnosis not present

## 2017-11-17 DIAGNOSIS — Z510 Encounter for antineoplastic radiation therapy: Secondary | ICD-10-CM | POA: Diagnosis not present

## 2017-11-17 DIAGNOSIS — Z87891 Personal history of nicotine dependence: Secondary | ICD-10-CM | POA: Diagnosis not present

## 2017-11-18 ENCOUNTER — Ambulatory Visit
Admission: RE | Admit: 2017-11-18 | Discharge: 2017-11-18 | Disposition: A | Discharge status: Home / Self Care | Payer: Federal, State, Local not specified - PPO | Source: Ambulatory Visit | Attending: Radiation Oncology | Admitting: Radiation Oncology

## 2017-11-18 DIAGNOSIS — Z8052 Family history of malignant neoplasm of bladder: Secondary | ICD-10-CM | POA: Diagnosis not present

## 2017-11-18 DIAGNOSIS — Z87891 Personal history of nicotine dependence: Secondary | ICD-10-CM | POA: Diagnosis not present

## 2017-11-18 DIAGNOSIS — Z801 Family history of malignant neoplasm of trachea, bronchus and lung: Secondary | ICD-10-CM | POA: Diagnosis not present

## 2017-11-18 DIAGNOSIS — Z791 Long term (current) use of non-steroidal anti-inflammatories (NSAID): Secondary | ICD-10-CM | POA: Diagnosis not present

## 2017-11-18 DIAGNOSIS — Z510 Encounter for antineoplastic radiation therapy: Secondary | ICD-10-CM | POA: Diagnosis not present

## 2017-11-18 DIAGNOSIS — C20 Malignant neoplasm of rectum: Secondary | ICD-10-CM | POA: Diagnosis not present

## 2017-11-18 DIAGNOSIS — Z79899 Other long term (current) drug therapy: Secondary | ICD-10-CM | POA: Diagnosis not present

## 2017-11-19 ENCOUNTER — Ambulatory Visit
Admission: RE | Admit: 2017-11-19 | Discharge: 2017-11-19 | Disposition: A | Discharge status: Home / Self Care | Payer: Federal, State, Local not specified - PPO | Source: Ambulatory Visit | Attending: Radiation Oncology | Admitting: Radiation Oncology

## 2017-11-19 DIAGNOSIS — Z8052 Family history of malignant neoplasm of bladder: Secondary | ICD-10-CM | POA: Diagnosis not present

## 2017-11-19 DIAGNOSIS — Z510 Encounter for antineoplastic radiation therapy: Secondary | ICD-10-CM | POA: Diagnosis not present

## 2017-11-19 DIAGNOSIS — Z79899 Other long term (current) drug therapy: Secondary | ICD-10-CM | POA: Diagnosis not present

## 2017-11-19 DIAGNOSIS — C20 Malignant neoplasm of rectum: Secondary | ICD-10-CM | POA: Diagnosis not present

## 2017-11-19 DIAGNOSIS — Z87891 Personal history of nicotine dependence: Secondary | ICD-10-CM | POA: Diagnosis not present

## 2017-11-19 DIAGNOSIS — Z791 Long term (current) use of non-steroidal anti-inflammatories (NSAID): Secondary | ICD-10-CM | POA: Diagnosis not present

## 2017-11-19 DIAGNOSIS — Z801 Family history of malignant neoplasm of trachea, bronchus and lung: Secondary | ICD-10-CM | POA: Diagnosis not present

## 2017-11-20 ENCOUNTER — Ambulatory Visit
Admission: RE | Admit: 2017-11-20 | Discharge: 2017-11-20 | Disposition: A | Discharge status: Home / Self Care | Payer: Federal, State, Local not specified - PPO | Source: Ambulatory Visit | Attending: Radiation Oncology | Admitting: Radiation Oncology

## 2017-11-20 DIAGNOSIS — Z791 Long term (current) use of non-steroidal anti-inflammatories (NSAID): Secondary | ICD-10-CM | POA: Diagnosis not present

## 2017-11-20 DIAGNOSIS — Z79899 Other long term (current) drug therapy: Secondary | ICD-10-CM | POA: Diagnosis not present

## 2017-11-20 DIAGNOSIS — Z8052 Family history of malignant neoplasm of bladder: Secondary | ICD-10-CM | POA: Diagnosis not present

## 2017-11-20 DIAGNOSIS — C20 Malignant neoplasm of rectum: Secondary | ICD-10-CM | POA: Diagnosis not present

## 2017-11-20 DIAGNOSIS — Z510 Encounter for antineoplastic radiation therapy: Secondary | ICD-10-CM | POA: Diagnosis not present

## 2017-11-20 DIAGNOSIS — Z801 Family history of malignant neoplasm of trachea, bronchus and lung: Secondary | ICD-10-CM | POA: Diagnosis not present

## 2017-11-20 DIAGNOSIS — Z87891 Personal history of nicotine dependence: Secondary | ICD-10-CM | POA: Diagnosis not present

## 2017-11-21 ENCOUNTER — Ambulatory Visit
Admission: RE | Admit: 2017-11-21 | Discharge: 2017-11-21 | Disposition: A | Discharge status: Home / Self Care | Payer: Federal, State, Local not specified - PPO | Source: Ambulatory Visit | Attending: Radiation Oncology | Admitting: Radiation Oncology

## 2017-11-21 DIAGNOSIS — Z87891 Personal history of nicotine dependence: Secondary | ICD-10-CM | POA: Diagnosis not present

## 2017-11-21 DIAGNOSIS — Z510 Encounter for antineoplastic radiation therapy: Secondary | ICD-10-CM | POA: Diagnosis not present

## 2017-11-21 DIAGNOSIS — Z801 Family history of malignant neoplasm of trachea, bronchus and lung: Secondary | ICD-10-CM | POA: Diagnosis not present

## 2017-11-21 DIAGNOSIS — Z8052 Family history of malignant neoplasm of bladder: Secondary | ICD-10-CM | POA: Diagnosis not present

## 2017-11-21 DIAGNOSIS — C20 Malignant neoplasm of rectum: Secondary | ICD-10-CM | POA: Diagnosis not present

## 2017-11-21 DIAGNOSIS — Z79899 Other long term (current) drug therapy: Secondary | ICD-10-CM | POA: Diagnosis not present

## 2017-11-21 DIAGNOSIS — Z791 Long term (current) use of non-steroidal anti-inflammatories (NSAID): Secondary | ICD-10-CM | POA: Diagnosis not present

## 2017-11-24 ENCOUNTER — Ambulatory Visit
Admission: RE | Admit: 2017-11-24 | Discharge: 2017-11-24 | Disposition: A | Discharge status: Home / Self Care | Payer: Federal, State, Local not specified - PPO | Source: Ambulatory Visit | Attending: Radiation Oncology | Admitting: Radiation Oncology

## 2017-11-24 DIAGNOSIS — Z510 Encounter for antineoplastic radiation therapy: Secondary | ICD-10-CM | POA: Diagnosis not present

## 2017-11-24 DIAGNOSIS — Z87891 Personal history of nicotine dependence: Secondary | ICD-10-CM | POA: Diagnosis not present

## 2017-11-24 DIAGNOSIS — Z791 Long term (current) use of non-steroidal anti-inflammatories (NSAID): Secondary | ICD-10-CM | POA: Diagnosis not present

## 2017-11-24 DIAGNOSIS — C187 Malignant neoplasm of sigmoid colon: Secondary | ICD-10-CM | POA: Diagnosis not present

## 2017-11-24 DIAGNOSIS — Z79899 Other long term (current) drug therapy: Secondary | ICD-10-CM | POA: Diagnosis not present

## 2017-11-24 DIAGNOSIS — C20 Malignant neoplasm of rectum: Secondary | ICD-10-CM | POA: Diagnosis not present

## 2017-11-24 DIAGNOSIS — Z8052 Family history of malignant neoplasm of bladder: Secondary | ICD-10-CM | POA: Diagnosis not present

## 2017-11-24 DIAGNOSIS — Z801 Family history of malignant neoplasm of trachea, bronchus and lung: Secondary | ICD-10-CM | POA: Diagnosis not present

## 2017-11-24 DIAGNOSIS — D125 Benign neoplasm of sigmoid colon: Secondary | ICD-10-CM | POA: Diagnosis not present

## 2017-11-25 ENCOUNTER — Ambulatory Visit
Admission: RE | Admit: 2017-11-25 | Discharge: 2017-11-25 | Disposition: A | Discharge status: Home / Self Care | Payer: Federal, State, Local not specified - PPO | Source: Ambulatory Visit | Attending: Radiation Oncology | Admitting: Radiation Oncology

## 2017-11-25 DIAGNOSIS — Z510 Encounter for antineoplastic radiation therapy: Secondary | ICD-10-CM | POA: Diagnosis not present

## 2017-11-25 DIAGNOSIS — Z801 Family history of malignant neoplasm of trachea, bronchus and lung: Secondary | ICD-10-CM | POA: Diagnosis not present

## 2017-11-25 DIAGNOSIS — Z8052 Family history of malignant neoplasm of bladder: Secondary | ICD-10-CM | POA: Diagnosis not present

## 2017-11-25 DIAGNOSIS — Z87891 Personal history of nicotine dependence: Secondary | ICD-10-CM | POA: Diagnosis not present

## 2017-11-25 DIAGNOSIS — C20 Malignant neoplasm of rectum: Secondary | ICD-10-CM | POA: Diagnosis not present

## 2017-11-25 DIAGNOSIS — Z79899 Other long term (current) drug therapy: Secondary | ICD-10-CM | POA: Diagnosis not present

## 2017-11-25 DIAGNOSIS — Z791 Long term (current) use of non-steroidal anti-inflammatories (NSAID): Secondary | ICD-10-CM | POA: Diagnosis not present

## 2017-11-26 ENCOUNTER — Ambulatory Visit
Admission: RE | Admit: 2017-11-26 | Discharge: 2017-11-26 | Disposition: A | Discharge status: Home / Self Care | Payer: Federal, State, Local not specified - PPO | Source: Ambulatory Visit | Attending: Radiation Oncology | Admitting: Radiation Oncology

## 2017-11-26 DIAGNOSIS — Z510 Encounter for antineoplastic radiation therapy: Secondary | ICD-10-CM | POA: Diagnosis not present

## 2017-11-26 DIAGNOSIS — C20 Malignant neoplasm of rectum: Secondary | ICD-10-CM | POA: Diagnosis not present

## 2017-11-26 DIAGNOSIS — Z8052 Family history of malignant neoplasm of bladder: Secondary | ICD-10-CM | POA: Diagnosis not present

## 2017-11-26 DIAGNOSIS — Z87891 Personal history of nicotine dependence: Secondary | ICD-10-CM | POA: Diagnosis not present

## 2017-11-26 DIAGNOSIS — Z791 Long term (current) use of non-steroidal anti-inflammatories (NSAID): Secondary | ICD-10-CM | POA: Diagnosis not present

## 2017-11-26 DIAGNOSIS — Z79899 Other long term (current) drug therapy: Secondary | ICD-10-CM | POA: Diagnosis not present

## 2017-11-26 DIAGNOSIS — Z801 Family history of malignant neoplasm of trachea, bronchus and lung: Secondary | ICD-10-CM | POA: Diagnosis not present

## 2017-11-27 ENCOUNTER — Ambulatory Visit
Admission: RE | Admit: 2017-11-27 | Discharge: 2017-11-27 | Disposition: A | Discharge status: Home / Self Care | Payer: Federal, State, Local not specified - PPO | Source: Ambulatory Visit | Attending: Radiation Oncology | Admitting: Radiation Oncology

## 2017-11-27 DIAGNOSIS — C20 Malignant neoplasm of rectum: Secondary | ICD-10-CM | POA: Diagnosis not present

## 2017-11-27 DIAGNOSIS — Z8052 Family history of malignant neoplasm of bladder: Secondary | ICD-10-CM | POA: Diagnosis not present

## 2017-11-27 DIAGNOSIS — Z87891 Personal history of nicotine dependence: Secondary | ICD-10-CM | POA: Diagnosis not present

## 2017-11-27 DIAGNOSIS — Z79899 Other long term (current) drug therapy: Secondary | ICD-10-CM | POA: Diagnosis not present

## 2017-11-27 DIAGNOSIS — Z801 Family history of malignant neoplasm of trachea, bronchus and lung: Secondary | ICD-10-CM | POA: Diagnosis not present

## 2017-11-27 DIAGNOSIS — Z791 Long term (current) use of non-steroidal anti-inflammatories (NSAID): Secondary | ICD-10-CM | POA: Diagnosis not present

## 2017-11-27 DIAGNOSIS — Z510 Encounter for antineoplastic radiation therapy: Secondary | ICD-10-CM | POA: Diagnosis not present

## 2017-11-28 ENCOUNTER — Ambulatory Visit
Admission: RE | Admit: 2017-11-28 | Discharge: 2017-11-28 | Disposition: A | Discharge status: Home / Self Care | Payer: Federal, State, Local not specified - PPO | Source: Ambulatory Visit | Attending: Radiation Oncology | Admitting: Radiation Oncology

## 2017-11-28 DIAGNOSIS — Z79899 Other long term (current) drug therapy: Secondary | ICD-10-CM | POA: Diagnosis not present

## 2017-11-28 DIAGNOSIS — Z8052 Family history of malignant neoplasm of bladder: Secondary | ICD-10-CM | POA: Diagnosis not present

## 2017-11-28 DIAGNOSIS — Z87891 Personal history of nicotine dependence: Secondary | ICD-10-CM | POA: Diagnosis not present

## 2017-11-28 DIAGNOSIS — Z510 Encounter for antineoplastic radiation therapy: Secondary | ICD-10-CM | POA: Diagnosis not present

## 2017-11-28 DIAGNOSIS — Z791 Long term (current) use of non-steroidal anti-inflammatories (NSAID): Secondary | ICD-10-CM | POA: Diagnosis not present

## 2017-11-28 DIAGNOSIS — Z801 Family history of malignant neoplasm of trachea, bronchus and lung: Secondary | ICD-10-CM | POA: Diagnosis not present

## 2017-11-28 DIAGNOSIS — C20 Malignant neoplasm of rectum: Secondary | ICD-10-CM | POA: Diagnosis not present

## 2017-11-30 ENCOUNTER — Encounter: Payer: Self-pay | Admitting: Oncology

## 2017-12-01 ENCOUNTER — Ambulatory Visit: Payer: Federal, State, Local not specified - PPO

## 2017-12-01 ENCOUNTER — Ambulatory Visit
Admission: RE | Admit: 2017-12-01 | Discharge: 2017-12-01 | Disposition: A | Discharge status: Home / Self Care | Payer: Federal, State, Local not specified - PPO | Source: Ambulatory Visit | Attending: Radiation Oncology | Admitting: Radiation Oncology

## 2017-12-01 ENCOUNTER — Inpatient Hospital Stay: Payer: Federal, State, Local not specified - PPO | Attending: Oncology | Admitting: Nurse Practitioner

## 2017-12-01 ENCOUNTER — Encounter: Payer: Self-pay | Admitting: Nurse Practitioner

## 2017-12-01 VITALS — BP 137/76 | HR 70 | Temp 98.0°F | Resp 18 | Ht 74.0 in | Wt 222.9 lb

## 2017-12-01 DIAGNOSIS — Z510 Encounter for antineoplastic radiation therapy: Secondary | ICD-10-CM | POA: Diagnosis not present

## 2017-12-01 DIAGNOSIS — C20 Malignant neoplasm of rectum: Secondary | ICD-10-CM | POA: Diagnosis not present

## 2017-12-01 DIAGNOSIS — Z791 Long term (current) use of non-steroidal anti-inflammatories (NSAID): Secondary | ICD-10-CM | POA: Diagnosis not present

## 2017-12-01 DIAGNOSIS — Z801 Family history of malignant neoplasm of trachea, bronchus and lung: Secondary | ICD-10-CM | POA: Diagnosis not present

## 2017-12-01 DIAGNOSIS — K6289 Other specified diseases of anus and rectum: Secondary | ICD-10-CM | POA: Diagnosis not present

## 2017-12-01 DIAGNOSIS — Z87891 Personal history of nicotine dependence: Secondary | ICD-10-CM | POA: Diagnosis not present

## 2017-12-01 DIAGNOSIS — Z8052 Family history of malignant neoplasm of bladder: Secondary | ICD-10-CM | POA: Diagnosis not present

## 2017-12-01 DIAGNOSIS — Z79899 Other long term (current) drug therapy: Secondary | ICD-10-CM | POA: Diagnosis not present

## 2017-12-01 NOTE — Progress Notes (Addendum)
[name] OFFICE PROGRESS NOTE   Diagnosis: Rectal cancer  INTERVAL HISTORY:   Jeffrey Nolan returns for follow-up.  He resumed radiation and Xeloda 11/10/2017.  He denies nausea/vomiting.  No mouth sores.  No diarrhea.  He is taking MiraLAX 3 times a day.  Bowels overall are moving regularly.  No hand or foot pain or redness.  For the past 10 days he has been having intermittent "throbbing" rectal pain.  The pain significantly worsens after a bowel movement.  He reports seeing his Psychologist, sport and exercise at [facility] last week.  A suppository was prescribed.  He notes improvement in the pain about an hour after inserting a suppository.  He denies fever and shaking chills.  The pain is in a different location than when he was admitted with perforated diverticulitis.  Objective:  Vital signs in last 24 hours:  Blood pressure 137/76, pulse 70, temperature 98 F (36.7 C), temperature source Oral, resp. rate 18, height 6\' 2"  (1.88 m), weight 222 lb 14.4 oz (101.1 kg), SpO2 95 %.    HEENT: No thrush or ulcers. Resp: Lungs clear bilaterally. Cardio: Regular rate and rhythm. GI: Abdomen soft and nontender.  No hepatomegaly.  Small soft external hemorrhoids versus skin tags. Vascular: No leg edema. Skin: Palms without erythema.  No skin breakdown at the perineum/perianal region.   Lab Results:  Lab Results  Component Value Date   WBC 5.7 11/09/2017   HGB 12.2 (L) 11/09/2017   HCT 36.1 (L) 11/09/2017   MCV 86.2 11/09/2017   PLT 236 11/09/2017   NEUTROABS 3.9 11/09/2017    Imaging:  No results found.  Medications: I have reviewed the patient's current medications.  Assessment/Plan: 1. Rectal cancer-invasive adenocarcinoma confirmed on biopsy of a mid rectal mass 10/03/2017 ? Staging CTs from 10/08/2017, indeterminate liver lesions, no rectal mass identified. No lymphadenopathy. ? MRI abdomen/pelvis 10/20/2017-no suspicious liver lesions, T3 N0 M0 staging with tumor measured at 9 cm  from the anal sphincter ? Initiation of Xeloda/radiation 10/27/2017, held beginning 11/05/2017 secondary to diverticulitis; resumed 11/10/2017 2.Distal sigmoid polyp on colonoscopy 10/03/2017-invasive adenocarcinoma involving a tubular adenoma, invasive carcinoma measured 1.1 cm with no lymphovascular space involvement and negative resection margin  Flexible sigmoidoscopy 10/08/2017-no residual polyp identified, area of previous polyp at 22 cm tattooed   3.  Admission 11/04/2017 with perforated diverticulitis.  Follow-up CT 11/08/2017 with no significant change in appearance of sigmoid diverticulitis and adjacent contained perforation.  No evidence of drainable abscess, free air or bowel obstruction.    Disposition: Jeffrey Nolan appears stable.  He continues radiation/Xeloda.  He is scheduled to complete the course of concurrent therapy 12/05/2017.  He reports surgery has been scheduled for 02/09/2018.  We will see him back about 2 weeks after surgery to review the pathology report.  The rectal pain is likely related to inflammation from radiation.  He will continue MiraLAX and add a stool softener.  He understands to contact the office if the pain worsens or he develops any associated symptoms.  Patient seen with Dr. Benay Spice.    Betsy Coder ANP/GNP-BC   12/01/2017  5:04 PM This was a shared visit with Ned Card.  Jeffrey Nolan will complete neoadjuvant therapy over the next week.  We suspect the rectal and urinary symptoms are related to radiation toxicity.  He will continue MiraLAX and add a stool softener.  He will use rectal suppositories as needed.  He reports surgery is being scheduled for April.  We will see him after the  surgery to review the pathology report and discuss adjuvant therapy.  Julieanne Manson, MD

## 2017-12-02 ENCOUNTER — Telehealth: Payer: Self-pay | Admitting: Nurse Practitioner

## 2017-12-02 ENCOUNTER — Ambulatory Visit: Payer: Federal, State, Local not specified - PPO

## 2017-12-02 ENCOUNTER — Ambulatory Visit
Admission: RE | Admit: 2017-12-02 | Discharge: 2017-12-02 | Disposition: A | Discharge status: Home / Self Care | Payer: Federal, State, Local not specified - PPO | Source: Ambulatory Visit | Attending: Radiation Oncology | Admitting: Radiation Oncology

## 2017-12-02 ENCOUNTER — Other Ambulatory Visit: Payer: Self-pay | Admitting: Nurse Practitioner

## 2017-12-02 DIAGNOSIS — C20 Malignant neoplasm of rectum: Secondary | ICD-10-CM

## 2017-12-02 DIAGNOSIS — Z510 Encounter for antineoplastic radiation therapy: Secondary | ICD-10-CM | POA: Diagnosis not present

## 2017-12-02 DIAGNOSIS — Z87891 Personal history of nicotine dependence: Secondary | ICD-10-CM | POA: Diagnosis not present

## 2017-12-02 DIAGNOSIS — Z801 Family history of malignant neoplasm of trachea, bronchus and lung: Secondary | ICD-10-CM | POA: Diagnosis not present

## 2017-12-02 DIAGNOSIS — Z79899 Other long term (current) drug therapy: Secondary | ICD-10-CM | POA: Diagnosis not present

## 2017-12-02 DIAGNOSIS — Z791 Long term (current) use of non-steroidal anti-inflammatories (NSAID): Secondary | ICD-10-CM | POA: Diagnosis not present

## 2017-12-02 DIAGNOSIS — Z8052 Family history of malignant neoplasm of bladder: Secondary | ICD-10-CM | POA: Diagnosis not present

## 2017-12-02 NOTE — Telephone Encounter (Signed)
Scheduled appt per 2/11 los - Sent reminder letter in the mail - f/u 45 mins on 02/23/18

## 2017-12-03 ENCOUNTER — Ambulatory Visit: Payer: Federal, State, Local not specified - PPO

## 2017-12-03 ENCOUNTER — Other Ambulatory Visit: Payer: Self-pay | Admitting: Oncology

## 2017-12-03 ENCOUNTER — Inpatient Hospital Stay: Payer: Federal, State, Local not specified - PPO

## 2017-12-03 ENCOUNTER — Ambulatory Visit
Admission: RE | Admit: 2017-12-03 | Discharge: 2017-12-03 | Disposition: A | Discharge status: Home / Self Care | Payer: Federal, State, Local not specified - PPO | Source: Ambulatory Visit | Attending: Radiation Oncology | Admitting: Radiation Oncology

## 2017-12-03 ENCOUNTER — Telehealth: Payer: Self-pay

## 2017-12-03 DIAGNOSIS — Z510 Encounter for antineoplastic radiation therapy: Secondary | ICD-10-CM | POA: Diagnosis not present

## 2017-12-03 DIAGNOSIS — C20 Malignant neoplasm of rectum: Secondary | ICD-10-CM

## 2017-12-03 DIAGNOSIS — K6289 Other specified diseases of anus and rectum: Secondary | ICD-10-CM | POA: Diagnosis not present

## 2017-12-03 LAB — CBC WITH DIFFERENTIAL (CANCER CENTER ONLY)
BASOS ABS: 0 10*3/uL (ref 0.0–0.1)
BASOS PCT: 0 %
Eosinophils Absolute: 0.2 10*3/uL (ref 0.0–0.5)
Eosinophils Relative: 4 %
HEMATOCRIT: 39.5 % (ref 38.4–49.9)
Hemoglobin: 13.2 g/dL (ref 13.0–17.1)
Lymphocytes Relative: 6 %
Lymphs Abs: 0.3 10*3/uL — ABNORMAL LOW (ref 0.9–3.3)
MCH: 29 pg (ref 27.2–33.4)
MCHC: 33.5 g/dL (ref 32.0–36.0)
MCV: 86.3 fL (ref 79.3–98.0)
Monocytes Absolute: 0.6 10*3/uL (ref 0.1–0.9)
Monocytes Relative: 12 %
NEUTROS ABS: 3.6 10*3/uL (ref 1.5–6.5)
NEUTROS PCT: 78 %
Platelet Count: 198 10*3/uL (ref 140–400)
RBC: 4.57 MIL/uL (ref 4.20–5.82)
RDW: 13.1 % (ref 11.0–14.6)
WBC: 4.7 10*3/uL (ref 4.0–10.3)

## 2017-12-03 NOTE — Telephone Encounter (Signed)
Called pt to verify that he has enough Xeloda tablets. Pt said "I have enough to get me through Monday so im good". This rn voiced understanding.

## 2017-12-04 ENCOUNTER — Ambulatory Visit: Payer: Federal, State, Local not specified - PPO

## 2017-12-04 ENCOUNTER — Ambulatory Visit
Admission: RE | Admit: 2017-12-04 | Discharge: 2017-12-04 | Disposition: A | Discharge status: Home / Self Care | Payer: Federal, State, Local not specified - PPO | Source: Ambulatory Visit | Attending: Radiation Oncology | Admitting: Radiation Oncology

## 2017-12-04 DIAGNOSIS — Z510 Encounter for antineoplastic radiation therapy: Secondary | ICD-10-CM | POA: Diagnosis not present

## 2017-12-04 DIAGNOSIS — C20 Malignant neoplasm of rectum: Secondary | ICD-10-CM | POA: Diagnosis not present

## 2017-12-05 ENCOUNTER — Ambulatory Visit: Payer: Federal, State, Local not specified - PPO

## 2017-12-05 ENCOUNTER — Ambulatory Visit
Admission: RE | Admit: 2017-12-05 | Discharge: 2017-12-05 | Disposition: A | Discharge status: Home / Self Care | Payer: Federal, State, Local not specified - PPO | Source: Ambulatory Visit | Attending: Radiation Oncology | Admitting: Radiation Oncology

## 2017-12-05 DIAGNOSIS — C20 Malignant neoplasm of rectum: Secondary | ICD-10-CM | POA: Diagnosis not present

## 2017-12-05 DIAGNOSIS — Z510 Encounter for antineoplastic radiation therapy: Secondary | ICD-10-CM | POA: Diagnosis not present

## 2017-12-08 ENCOUNTER — Ambulatory Visit
Admission: RE | Admit: 2017-12-08 | Discharge: 2017-12-08 | Disposition: A | Discharge status: Home / Self Care | Payer: Federal, State, Local not specified - PPO | Source: Ambulatory Visit | Attending: Radiation Oncology | Admitting: Radiation Oncology

## 2017-12-08 DIAGNOSIS — Z510 Encounter for antineoplastic radiation therapy: Secondary | ICD-10-CM | POA: Diagnosis not present

## 2017-12-08 DIAGNOSIS — C20 Malignant neoplasm of rectum: Secondary | ICD-10-CM | POA: Diagnosis not present

## 2017-12-09 ENCOUNTER — Encounter: Payer: Self-pay | Admitting: Radiation Oncology

## 2017-12-09 NOTE — Progress Notes (Signed)
  Radiation Oncology         [phone] ________________________________  Name: Jeffrey Nolan MRN: [id number]  Date: 12/09/2017  DOB: [birth year]  End of Treatment Note  Diagnosis: invasive adenocarcinoma of the rectum    Indication for treatment:  Curative       Radiation treatment dates: 10/27/17-12/08/17  Site/dose:  1) Rectum/ 45 Gy in 25 fractions   2) Rectum boost/ 5.4 Gy in 3 fractions  Beams/energy:  1) 3D/ 15X, 6X    2) 3D/ 15X, 6X  Narrative: The patient tolerated radiation treatment relatively well. Patient complained of chronic 4/10 pain to the rectum during treatment. He denied rectal bleeding or drainage.   Plan: The patient has completed radiation treatment. The patient will return to radiation oncology clinic for routine followup in one month. I advised them to call or return sooner if they have any questions or concerns related to their recovery or treatment.  ------------------------------------------------  Jodelle Gross, MD, PhD  This document serves as a record of services personally performed by Kyung Rudd, MD. It was created on his behalf by Bethann Humble, a trained medical scribe. The creation of this record is based on the scribe's personal observations and the provider's statements to them. This document has been checked and approved by the attending provider.

## 2017-12-10 ENCOUNTER — Encounter: Payer: Self-pay | Admitting: Nurse Practitioner

## 2017-12-15 ENCOUNTER — Encounter: Payer: Self-pay | Admitting: Oncology

## 2017-12-15 ENCOUNTER — Encounter: Payer: Self-pay | Admitting: Nurse Practitioner

## 2017-12-17 ENCOUNTER — Encounter: Payer: Self-pay | Admitting: Nurse Practitioner

## 2017-12-17 ENCOUNTER — Telehealth: Payer: Self-pay

## 2017-12-17 NOTE — Telephone Encounter (Signed)
Called to inform pt that work note is available. Pt voiced understanding

## 2017-12-31 ENCOUNTER — Encounter: Payer: Self-pay | Admitting: Nurse Practitioner

## 2018-01-06 ENCOUNTER — Ambulatory Visit: Payer: Self-pay | Admitting: Radiation Oncology

## 2018-01-07 DIAGNOSIS — Z01818 Encounter for other preprocedural examination: Secondary | ICD-10-CM | POA: Diagnosis not present

## 2018-01-07 DIAGNOSIS — C20 Malignant neoplasm of rectum: Secondary | ICD-10-CM | POA: Diagnosis not present

## 2018-02-09 DIAGNOSIS — K5732 Diverticulitis of large intestine without perforation or abscess without bleeding: Secondary | ICD-10-CM | POA: Diagnosis not present

## 2018-02-09 DIAGNOSIS — C20 Malignant neoplasm of rectum: Secondary | ICD-10-CM | POA: Diagnosis not present

## 2018-02-09 DIAGNOSIS — G4700 Insomnia, unspecified: Secondary | ICD-10-CM | POA: Diagnosis not present

## 2018-02-09 DIAGNOSIS — R590 Localized enlarged lymph nodes: Secondary | ICD-10-CM | POA: Diagnosis not present

## 2018-02-09 DIAGNOSIS — K565 Intestinal adhesions [bands], unspecified as to partial versus complete obstruction: Secondary | ICD-10-CM | POA: Diagnosis not present

## 2018-02-09 DIAGNOSIS — G8918 Other acute postprocedural pain: Secondary | ICD-10-CM | POA: Diagnosis not present

## 2018-02-13 DIAGNOSIS — C20 Malignant neoplasm of rectum: Secondary | ICD-10-CM | POA: Diagnosis not present

## 2018-02-13 DIAGNOSIS — Z9889 Other specified postprocedural states: Secondary | ICD-10-CM | POA: Diagnosis not present

## 2018-02-13 DIAGNOSIS — K5900 Constipation, unspecified: Secondary | ICD-10-CM | POA: Diagnosis not present

## 2018-02-13 DIAGNOSIS — R079 Chest pain, unspecified: Secondary | ICD-10-CM | POA: Diagnosis not present

## 2018-02-13 DIAGNOSIS — R110 Nausea: Secondary | ICD-10-CM | POA: Diagnosis not present

## 2018-02-13 DIAGNOSIS — R1084 Generalized abdominal pain: Secondary | ICD-10-CM | POA: Diagnosis not present

## 2018-02-13 DIAGNOSIS — C218 Malignant neoplasm of overlapping sites of rectum, anus and anal canal: Secondary | ICD-10-CM | POA: Diagnosis not present

## 2018-02-13 DIAGNOSIS — R140 Abdominal distension (gaseous): Secondary | ICD-10-CM | POA: Diagnosis not present

## 2018-02-14 DIAGNOSIS — R110 Nausea: Secondary | ICD-10-CM | POA: Diagnosis not present

## 2018-02-14 DIAGNOSIS — R1084 Generalized abdominal pain: Secondary | ICD-10-CM | POA: Diagnosis not present

## 2018-02-14 DIAGNOSIS — C218 Malignant neoplasm of overlapping sites of rectum, anus and anal canal: Secondary | ICD-10-CM | POA: Diagnosis not present

## 2018-02-14 DIAGNOSIS — R079 Chest pain, unspecified: Secondary | ICD-10-CM | POA: Diagnosis not present

## 2018-02-15 DIAGNOSIS — Z483 Aftercare following surgery for neoplasm: Secondary | ICD-10-CM | POA: Diagnosis not present

## 2018-02-15 DIAGNOSIS — Z433 Encounter for attention to colostomy: Secondary | ICD-10-CM | POA: Diagnosis not present

## 2018-02-15 DIAGNOSIS — C20 Malignant neoplasm of rectum: Secondary | ICD-10-CM | POA: Diagnosis not present

## 2018-02-16 DIAGNOSIS — Z483 Aftercare following surgery for neoplasm: Secondary | ICD-10-CM | POA: Diagnosis not present

## 2018-02-16 DIAGNOSIS — Z433 Encounter for attention to colostomy: Secondary | ICD-10-CM | POA: Diagnosis not present

## 2018-02-16 DIAGNOSIS — C20 Malignant neoplasm of rectum: Secondary | ICD-10-CM | POA: Diagnosis not present

## 2018-02-19 DIAGNOSIS — Z932 Ileostomy status: Secondary | ICD-10-CM | POA: Diagnosis not present

## 2018-02-20 DIAGNOSIS — Z433 Encounter for attention to colostomy: Secondary | ICD-10-CM | POA: Diagnosis not present

## 2018-02-20 DIAGNOSIS — C20 Malignant neoplasm of rectum: Secondary | ICD-10-CM | POA: Diagnosis not present

## 2018-02-20 DIAGNOSIS — Z483 Aftercare following surgery for neoplasm: Secondary | ICD-10-CM | POA: Diagnosis not present

## 2018-02-23 ENCOUNTER — Telehealth: Payer: Self-pay | Admitting: Oncology

## 2018-02-23 ENCOUNTER — Inpatient Hospital Stay: Payer: Federal, State, Local not specified - PPO | Attending: Oncology | Admitting: Oncology

## 2018-02-23 VITALS — BP 112/76 | HR 92 | Temp 98.3°F | Resp 17 | Ht 74.0 in | Wt 208.4 lb

## 2018-02-23 DIAGNOSIS — M6289 Other specified disorders of muscle: Secondary | ICD-10-CM | POA: Diagnosis not present

## 2018-02-23 DIAGNOSIS — C20 Malignant neoplasm of rectum: Secondary | ICD-10-CM | POA: Insufficient documentation

## 2018-02-23 DIAGNOSIS — Z932 Ileostomy status: Secondary | ICD-10-CM | POA: Insufficient documentation

## 2018-02-23 MED ORDER — INDOMETHACIN 50 MG PO CAPS: 50.0000 mg | ORAL_CAPSULE | Freq: Two times a day (BID) | ORAL | 0 refills | Status: DC

## 2018-02-23 NOTE — Telephone Encounter (Signed)
Scheduled appt per 5/6 los. - sent reminder letter in the mail

## 2018-02-23 NOTE — Addendum Note (Signed)
Addended by: Mathis Fare on: 02/23/2018 05:45 PM   Modules accepted: Orders

## 2018-02-23 NOTE — Progress Notes (Signed)
[facility] OFFICE PROGRESS NOTE   Diagnosis: Rectal cancer  INTERVAL HISTORY:   Mr. Nevares returns for a scheduled visit.  He underwent a laparoscopic-assisted low anterior resection and loop ileostomy on 02/09/2018.  Only residual scar was identified in the rectum.  The pathology revealed diverticulitis of the sigmoid colon and rectum.  Foreign body giant cell reaction and fibrosis was noted.  No malignancy.  There were 12- lymph nodes.  He reports a good appetite.  He is having output from the ileostomy.  He altered his diet to thicken the stool output.  He reports pain in the left first MTP joint for the past 3 days.  This is limiting his ability to walk.  He has no history of gout.  Objective:  Vital signs in last 24 hours:  Blood pressure 112/76, pulse 92, temperature 98.3 F (36.8 C), temperature source Oral, resp. rate 17, height 6\' 2"  (1.88 m), weight 208 lb 6.4 oz (94.5 kg), SpO2 99 %.    Resp: Lungs clear bilaterally Cardio: Regular rate and rhythm GI: No hepatomegaly, right lower quadrant ileostomy, left lower quadrant drain site with a 1 cm superficial skin opening Vascular: No leg edema Musculoskeletal: Pain with motion at the left first MTP joint with mild induration/erythema, tenderness at the left first MTP joint    Lab Results:  02/13/2018 at [facility]: Hemoglobin 14.3, platelets 271,000, white count 5.8, ANC 4.6 02/13/2018 at [facility]: BUN 11, creatinine 1.1  Medications: I have reviewed the patient's current medications.   Assessment/Plan: 1. Rectal cancer-invasive adenocarcinoma confirmed on biopsy of a mid rectal mass 10/03/2017 ? Staging CTs from 10/08/2017, indeterminate liver lesions, no rectal mass identified. No lymphadenopathy. ? MRI abdomen/pelvis 10/20/2017-no suspicious liver lesions, T3 N0 M0 staging with tumor measured at 9 cm from the anal sphincter ? Initiation of Xeloda/radiation 10/27/2017, held beginning 11/05/2017  secondary to diverticulitis; resumed 11/10/2017, completed 12/08/2017 ? Laparoscopic low anterior resection 02/09/2018- diverticulitis/fibrosis, no malignancy identified, 12- lymph nodes 2.Distal sigmoid polyp on colonoscopy 10/03/2017-invasive adenocarcinoma involving a tubular adenoma, invasive carcinoma measured 1.1 cm with no lymphovascular space involvement and negative resection margin  Flexible sigmoidoscopy 10/08/2017-no residual polyp identified, area of previous polyp at 22 cm tattooed  3.Admission 11/04/2017 with perforated diverticulitis.  Follow-up CT 11/08/2017 with no significant change in appearance of sigmoid diverticulitis and adjacent contained perforation.  No evidence of drainable abscess, free air or bowel obstruction.  4.  Gout-left first MTP 02/23/2018-prescribe short course of indomethacin   Disposition: Mr. Chiquito underwent a low anterior resection/ileostomy on 02/09/2018.  The pathology revealed no residual disease.  We discussed the indication for adjuvant therapy.  I explained there is no clear recommendation for adjuvant therapy in patients with a pathologic CR in the setting of preoperative clinical stage II disease.  However, he qualifies for adjuvant 5-fluorouracil based therapy.  I recommend adjuvant therapy in his case based on his young age, the "perforated diverticulitis "earlier this year and his tolerance of the neoadjuvant Xeloda.  We reviewed potential toxicities associated with the adjuvant Xeloda including the chance for mucositis, diarrhea, hand/foot syndrome.  He agrees to proceed.  He appears to have an acute gout flare in the left great toe.  He will complete a short course of indomethacin.  The plan is to initiate adjuvant capecitabine on 03/02/2018.  He will contact us if the left lower quadrant drain site has not showed further healing by 03/02/2018.  Mr. Ybarra will return for an office visit on  03/18/2018.  25 minutes were spent with  the patient today.  The majority of the time was used for counseling and coordination of care.  Betsy Coder, MD  02/23/2018  4:05 PM

## 2018-02-24 ENCOUNTER — Telehealth: Payer: Self-pay | Admitting: Pharmacist

## 2018-02-24 DIAGNOSIS — Z433 Encounter for attention to colostomy: Secondary | ICD-10-CM | POA: Diagnosis not present

## 2018-02-24 DIAGNOSIS — C20 Malignant neoplasm of rectum: Secondary | ICD-10-CM | POA: Diagnosis not present

## 2018-02-24 DIAGNOSIS — Z483 Aftercare following surgery for neoplasm: Secondary | ICD-10-CM | POA: Diagnosis not present

## 2018-02-24 MED ORDER — CAPECITABINE 500 MG PO TABS: 2000.0000 mg | ORAL_TABLET | Freq: Two times a day (BID) | ORAL | 1 refills | Status: DC

## 2018-02-24 NOTE — Telephone Encounter (Signed)
Oral Chemotherapy Pharmacist Encounter   I spoke with patient for overview of adjuvant: Xeloda.   Counseled patient on administration, dosing, side effects, monitoring, drug-food interactions, safe handling, storage, and disposal.  Patient will take Xeloda 500mg  tablets, 4 tablets (2000mg ) by mouth in AM and 4 tabs (2000mg ) by mouth in PM, within 30 minutes of finishing meals, on days 1-14 of each 21 day cycle.  This is the same number of tablets previously taken with Xeloda. We discussed frequency difference.  Xeloda start date: 03/02/18 (pending drain site healing)  Side effects of Xeloda include but not limited to: fatigue, decreased blood counts, GI upset, diarrhea, and hand-foot syndrome. Patient has loperamide at home and will call the office if diarrhea develops.    Patient experienced diverticulitis with neoadjuvant Xeloda/XRT and will alert the office if any of those symptoms recur.  Reviewed with patient importance of keeping a medication schedule and plan for any missed doses.  Jeffrey Nolan voiced understanding and appreciation.   All questions answered. Medication reconciliation performed and medication/allergy list updated.  Patient informed of [facility] as dispensing pharmacy as before. I provided phone number to dispensing pharmacy [phone]) and to Oral Oncology Clinic.   Patient instructed to call dispensing pharmacy on Thursday 5/9 PM for status update if he had not yet heard from them.  Patient knows to call the office with questions or concerns. Oral Oncology Clinic will continue to follow.  Thank you,  Johny Drilling, PharmD, BCPS, BCOP 02/24/2018   9:43 AM Oral Oncology Clinic [phone]

## 2018-02-24 NOTE — Telephone Encounter (Signed)
Oral Oncology Pharmacist Encounter  Received new prescription for Xeloda (capecitabine) for the adjuvant treatment of rectal cancer, planned duration 3-6 months.  Patient is s/p neoadjuvant Xeloda + radiation 10/27/17-12/08/17 Patient tolerated 2000mg  BID during neoadjuvant course Adjuvant treatment will be the dame daily dose of Xeloda, however frequency will change to 2 weeks on, 1 week off, repeat every 3 weeks  Labs from Epic assessed, OK for treatment, however last labs to assess from Jan 2019.  Current medication list in Epic reviewed, no DDIs with Xeloda identified.  Prescription has been e-scribed to [facility] for benefits analysis and approval per insurance requirement. This was the mandated pharmacy for Xeloda during neoadjuvant course.  Oral Oncology Clinic will continue to follow for insurance authorization, copayment issues, initial counseling and start date.  Johny Drilling, PharmD, BCPS, BCOP 02/24/2018 9:11 AM Oral Oncology Clinic [phone]

## 2018-02-26 ENCOUNTER — Telehealth: Payer: Self-pay

## 2018-02-26 DIAGNOSIS — Z932 Ileostomy status: Secondary | ICD-10-CM | POA: Diagnosis not present

## 2018-02-26 NOTE — Telephone Encounter (Signed)
Spoke with pt. Pt reports surgical site "still hasnt healed up". He notes that the drainage is improving, "im only changing the dressing once a day and there's a small spot of drainage there". Per Dr. Benay Spice pt to proceed with treatement Monday as long as things continue to improve. Pt voiced understanding. Also made note that "medication that he gave me for gout is really helping". Wanted to know if anything additional needed to be sent in to his pharmacy for gout.  Will make MD aware.

## 2018-02-27 ENCOUNTER — Other Ambulatory Visit: Payer: Self-pay

## 2018-02-27 DIAGNOSIS — Z433 Encounter for attention to colostomy: Secondary | ICD-10-CM | POA: Diagnosis not present

## 2018-02-27 DIAGNOSIS — C20 Malignant neoplasm of rectum: Secondary | ICD-10-CM | POA: Diagnosis not present

## 2018-02-27 DIAGNOSIS — Z483 Aftercare following surgery for neoplasm: Secondary | ICD-10-CM | POA: Diagnosis not present

## 2018-03-03 ENCOUNTER — Encounter: Payer: Self-pay | Admitting: Oncology

## 2018-03-05 DIAGNOSIS — C20 Malignant neoplasm of rectum: Secondary | ICD-10-CM | POA: Diagnosis not present

## 2018-03-05 DIAGNOSIS — Z483 Aftercare following surgery for neoplasm: Secondary | ICD-10-CM | POA: Diagnosis not present

## 2018-03-05 DIAGNOSIS — Z433 Encounter for attention to colostomy: Secondary | ICD-10-CM | POA: Diagnosis not present

## 2018-03-05 NOTE — Telephone Encounter (Signed)
Oral Oncology Patient Advocate Encounter  Received confirmation from [facility] that the patient received his initial shipment of Xeloda on 02/28/2018.  Fabio Asa. Melynda Keller, [facility] [place] [phone] 03/05/2018 12:09 PM

## 2018-03-09 ENCOUNTER — Encounter: Payer: Self-pay | Admitting: Oncology

## 2018-03-09 ENCOUNTER — Telehealth: Payer: Self-pay

## 2018-03-09 NOTE — Telephone Encounter (Signed)
Spoke to pt regarding MyChart message. Per Dr. Benay Spice, pain not likely related to treatment through our office. Pt voiced understanding. Pt reports that he has "post op appt scheduled for this Wednesday and ill ask them about everything". This RN voiced understanding.

## 2018-03-11 ENCOUNTER — Encounter: Payer: Self-pay | Admitting: Oncology

## 2018-03-12 ENCOUNTER — Encounter: Payer: Self-pay | Admitting: Oncology

## 2018-03-17 ENCOUNTER — Encounter: Payer: Self-pay | Admitting: Oncology

## 2018-03-18 ENCOUNTER — Inpatient Hospital Stay (HOSPITAL_BASED_OUTPATIENT_CLINIC_OR_DEPARTMENT_OTHER): Payer: Federal, State, Local not specified - PPO | Admitting: Oncology

## 2018-03-18 ENCOUNTER — Inpatient Hospital Stay: Payer: Federal, State, Local not specified - PPO

## 2018-03-18 ENCOUNTER — Telehealth: Payer: Self-pay | Admitting: Oncology

## 2018-03-18 VITALS — BP 110/71 | HR 83 | Temp 98.1°F | Resp 20 | Ht 74.0 in | Wt 210.0 lb

## 2018-03-18 DIAGNOSIS — C20 Malignant neoplasm of rectum: Secondary | ICD-10-CM | POA: Diagnosis not present

## 2018-03-18 DIAGNOSIS — K6289 Other specified diseases of anus and rectum: Secondary | ICD-10-CM | POA: Diagnosis not present

## 2018-03-18 DIAGNOSIS — Z932 Ileostomy status: Secondary | ICD-10-CM

## 2018-03-18 DIAGNOSIS — M6289 Other specified disorders of muscle: Secondary | ICD-10-CM | POA: Diagnosis not present

## 2018-03-18 LAB — CMP (CANCER CENTER ONLY)
ALT: 21 U/L (ref 0–55)
ANION GAP: 13 — AB (ref 3–11)
AST: 17 U/L (ref 5–34)
Albumin: 4.2 g/dL (ref 3.5–5.0)
Alkaline Phosphatase: 67 U/L (ref 40–150)
BILIRUBIN TOTAL: 0.3 mg/dL (ref 0.2–1.2)
BUN: 14 mg/dL (ref 7–26)
CHLORIDE: 99 mmol/L (ref 98–109)
CO2: 23 mmol/L (ref 22–29)
Calcium: 9.7 mg/dL (ref 8.4–10.4)
Creatinine: 1.18 mg/dL (ref 0.70–1.30)
GFR, Est AFR Am: >60 mL/min (ref >60)
GFR, Estimated: >60 mL/min (ref >60)
Glucose, Bld: 104 mg/dL (ref 70–140)
POTASSIUM: 4.4 mmol/L (ref 3.5–5.1)
SODIUM: 135 mmol/L — AB (ref 136–145)
TOTAL PROTEIN: 7.4 g/dL (ref 6.4–8.3)

## 2018-03-18 LAB — CBC WITH DIFFERENTIAL (CANCER CENTER ONLY)
BASOS ABS: 0 10*3/uL (ref 0.0–0.1)
Basophils Relative: 1 %
EOS PCT: 2 %
Eosinophils Absolute: 0.1 10*3/uL (ref 0.0–0.5)
HEMATOCRIT: 40.7 % (ref 38.4–49.9)
Hemoglobin: 13.7 g/dL (ref 13.0–17.1)
LYMPHS ABS: 0.5 10*3/uL — AB (ref 0.9–3.3)
LYMPHS PCT: 13 %
MCH: 29.6 pg (ref 27.2–33.4)
MCHC: 33.7 g/dL (ref 32.0–36.0)
MCV: 87.9 fL (ref 79.3–98.0)
Monocytes Absolute: 0.3 10*3/uL (ref 0.1–0.9)
Monocytes Relative: 7 %
NEUTROS ABS: 3.2 10*3/uL (ref 1.5–6.5)
Neutrophils Relative %: 77 %
Platelet Count: 229 10*3/uL (ref 140–400)
RBC: 4.63 MIL/uL (ref 4.20–5.82)
RDW: 13.3 % (ref 11.0–14.6)
WBC Count: 4.1 10*3/uL (ref 4.0–10.3)

## 2018-03-18 LAB — CEA (IN HOUSE-CHCC): CEA (CHCC-IN HOUSE): 1.03 ng/mL (ref 0.00–5.00)

## 2018-03-18 LAB — URIC ACID: URIC ACID, SERUM: 6.8 mg/dL (ref 2.6–7.4)

## 2018-03-18 NOTE — Telephone Encounter (Signed)
Scheduled appt per 5/29 los - pt aware .

## 2018-03-18 NOTE — Progress Notes (Signed)
  [facility] OFFICE PROGRESS NOTE   Diagnosis: Rectal cancer  INTERVAL HISTORY:   [name] completed a cycle of Xeloda beginning 03/02/2018.  No hand/foot pain, or diarrhea.  He had increased pain at the rectum and was evaluated by Dr. Drue Flirt.  He is scheduled for preoperative barium enema next week. He had a single right sided mouth sore.  This did not interfere with eating.  Objective:  Vital signs in last 24 hours:  Blood pressure 110/71, pulse 83, temperature 98.1 F (36.7 C), temperature source Oral, resp. rate 20, height 6\' 2"  (1.88 m), weight 210 lb (95.3 kg), SpO2 96 %.    HEENT: No thrush or ulcers Resp: Lungs clear bilaterally Cardio: Regular rate and rhythm GI: No hepatosplenomegaly, right lower quadrant ileostomy Vascular: No leg edema  Skin: Palms without erythema    Lab Results:  Lab Results  Component Value Date   WBC 4.1 03/18/2018   HGB 13.7 03/18/2018   HCT 40.7 03/18/2018   MCV 87.9 03/18/2018   PLT 229 03/18/2018   NEUTROABS 3.2 03/18/2018    CMP  Lab Results  Component Value Date   NA 135 (L) 03/18/2018   K 4.4 03/18/2018   CL 99 03/18/2018   CO2 23 03/18/2018   GLUCOSE 104 03/18/2018   BUN 14 03/18/2018   CREATININE 1.18 03/18/2018   CALCIUM 9.7 03/18/2018   PROT 7.4 03/18/2018   ALBUMIN 4.2 03/18/2018   AST 17 03/18/2018   ALT 21 03/18/2018   ALKPHOS 67 03/18/2018   BILITOT 0.3 03/18/2018   GFRNONAA >60 03/18/2018   GFRAA >60 03/18/2018    Lab Results  Component Value Date   CEA1 1.03 03/18/2018     Medications: I have reviewed the patient's current medications.   Assessment/Plan: 1. Rectal cancer-invasive adenocarcinoma confirmed on biopsy of a mid rectal mass 10/03/2017 ? Staging CTs from 10/08/2017, indeterminate liver lesions, no rectal mass identified. No lymphadenopathy. ? MRI abdomen/pelvis 10/20/2017-no suspicious liver lesions, T3 N0 M0 staging with tumor measured at 9 cm from the anal  sphincter ? Initiation of Xeloda/radiation 10/27/2017, held beginning 11/05/2017 secondary to diverticulitis;resumed 11/10/2017, completed 12/08/2017 ? Laparoscopic low anterior resection 02/09/2018- diverticulitis/fibrosis, no malignancy identified, 12 negative lymph nodes ? Cycle 1 adjuvant Xeloda 03/02/2018 2.Distal sigmoid polyp on colonoscopy 10/03/2017-invasive adenocarcinoma involving a tubular adenoma, invasive carcinoma measured 1.1 cm with no lymphovascular space involvement and negative resection margin  Flexible sigmoidoscopy 10/08/2017-no residual polyp identified, area of previous polyp at 22 cm tattooed  3.Admission 11/04/2017 with perforated diverticulitis. Follow-up CT 11/08/2017 with no significant change in appearance of sigmoid diverticulitis and adjacent contained perforation. No evidence of drainable abscess, free air or bowel obstruction.  4.  Gout-left first MTP 02/23/2018-prescribe short course of indomethacin     Disposition: Jeffrey Nolan appears well.  He tolerated the first cycle of adjuvant therapy without significant acute toxicity.  He would like to have the ileostomy reversed as soon as possible.  He will not agree to further adjuvant therapy until the ileostomy takedown is performed. I contacted Dr. Drue Flirt.  She will schedule the ileostomy takedown for the week of 03/30/2018.  [name] will return for an office visit on 04/08/2018.  We will discuss the indication for additional adjuvant Xeloda then.  15 minutes were spent with the patient today.  The majority of the time was used for counseling and coordination of care.  Betsy Coder, MD  03/18/2018  12:57 PM

## 2018-03-26 DIAGNOSIS — C20 Malignant neoplasm of rectum: Secondary | ICD-10-CM | POA: Diagnosis not present

## 2018-03-26 DIAGNOSIS — Z9889 Other specified postprocedural states: Secondary | ICD-10-CM | POA: Diagnosis not present

## 2018-04-02 DIAGNOSIS — Z85048 Personal history of other malignant neoplasm of rectum, rectosigmoid junction, and anus: Secondary | ICD-10-CM | POA: Diagnosis not present

## 2018-04-02 DIAGNOSIS — Z932 Ileostomy status: Secondary | ICD-10-CM | POA: Diagnosis not present

## 2018-04-02 DIAGNOSIS — G4700 Insomnia, unspecified: Secondary | ICD-10-CM | POA: Diagnosis not present

## 2018-04-02 DIAGNOSIS — C20 Malignant neoplasm of rectum: Secondary | ICD-10-CM | POA: Diagnosis not present

## 2018-04-02 DIAGNOSIS — Z432 Encounter for attention to ileostomy: Secondary | ICD-10-CM | POA: Diagnosis not present

## 2018-04-02 HISTORY — PX: ILEOSTOMY CLOSURE: SHX1784

## 2018-04-03 DIAGNOSIS — R140 Abdominal distension (gaseous): Secondary | ICD-10-CM | POA: Diagnosis not present

## 2018-04-07 ENCOUNTER — Telehealth: Payer: Self-pay

## 2018-04-07 DIAGNOSIS — C20 Malignant neoplasm of rectum: Secondary | ICD-10-CM

## 2018-04-07 MED ORDER — CAPECITABINE 500 MG PO TABS: 2000.0000 mg | ORAL_TABLET | Freq: Two times a day (BID) | ORAL | 1 refills | Status: DC

## 2018-04-07 NOTE — Telephone Encounter (Signed)
Called to inform pt of new appt. Pt voiced understanding. Pt requests refill of xeloda, will consult MD.

## 2018-04-08 ENCOUNTER — Inpatient Hospital Stay: Payer: Federal, State, Local not specified - PPO | Admitting: Oncology

## 2018-04-15 ENCOUNTER — Telehealth: Payer: Self-pay | Admitting: Oncology

## 2018-04-15 ENCOUNTER — Inpatient Hospital Stay: Payer: Federal, State, Local not specified - PPO | Attending: Oncology | Admitting: Oncology

## 2018-04-15 VITALS — BP 114/71 | HR 75 | Temp 98.3°F | Resp 18 | Ht 74.0 in | Wt 208.8 lb

## 2018-04-15 DIAGNOSIS — C20 Malignant neoplasm of rectum: Secondary | ICD-10-CM | POA: Diagnosis not present

## 2018-04-15 DIAGNOSIS — Z932 Ileostomy status: Secondary | ICD-10-CM | POA: Diagnosis not present

## 2018-04-15 NOTE — Progress Notes (Signed)
[facility] OFFICE PROGRESS NOTE   Diagnosis: Colon cancer  INTERVAL HISTORY:   Jeffrey Nolan underwent closure of the loop ileostomy on 04/02/2018.  He was discharged on 04/05/2018.  He reports constipation.  The right abdominal wound remains open.  He laid out in the sun yesterday and developed a sunburn over the abdomen.  Jeffrey Nolan does not wish to resume adjuvant capecitabine.  He reports he felt tired when he was on capecitabine in the past.  Objective:  Vital signs in last 24 hours:  Blood pressure 114/71, pulse 75, temperature 98.3 F (36.8 C), temperature source Oral, resp. rate 18, height 6\' 2"  (1.88 m), weight 208 lb 12.8 oz (94.7 kg), SpO2 98 %.    HEENT: Neck without mass Lymphatics: No cervical, supraclavicular, axillary, or inguinal nodes Resp: Lungs clear bilaterally Cardio: Regular rate and rhythm GI: No hepatomegaly, healed midline incision, nontender, no mass.  The ileostomy wound remains open superficially with granulation tissue. Vascular: No leg edema  Skin: Confluent erythema over the abdomen and legs   Lab Results:  Lab Results  Component Value Date   WBC 4.1 03/18/2018   HGB 13.7 03/18/2018   HCT 40.7 03/18/2018   MCV 87.9 03/18/2018   PLT 229 03/18/2018   NEUTROABS 3.2 03/18/2018    CMP  Lab Results  Component Value Date   NA 135 (L) 03/18/2018   K 4.4 03/18/2018   CL 99 03/18/2018   CO2 23 03/18/2018   GLUCOSE 104 03/18/2018   BUN 14 03/18/2018   CREATININE 1.18 03/18/2018   CALCIUM 9.7 03/18/2018   PROT 7.4 03/18/2018   ALBUMIN 4.2 03/18/2018   AST 17 03/18/2018   ALT 21 03/18/2018   ALKPHOS 67 03/18/2018   BILITOT 0.3 03/18/2018   GFRNONAA >60 03/18/2018   GFRAA >60 03/18/2018    Lab Results  Component Value Date   CEA1 1.03 03/18/2018    Medications: I have reviewed the patient's current medications.   Assessment/Plan: 1. Rectal cancer-invasive adenocarcinoma confirmed on biopsy of a mid rectal mass  10/03/2017 ? Staging CTs from 10/08/2017, indeterminate liver lesions, no rectal mass identified. No lymphadenopathy. ? MRI abdomen/pelvis 10/20/2017-no suspicious liver lesions, T3 N0 M0 staging with tumor measured at 9 cm from the anal sphincter ? Initiation of Xeloda/radiation 10/27/2017, held beginning 11/05/2017 secondary to diverticulitis;resumed 11/10/2017, completed 12/08/2017 ? Laparoscopic low anterior resection 02/09/2018- diverticulitis/fibrosis, no malignancy identified, 12 negative lymph nodes ? Cycle 1 adjuvant Xeloda 03/02/2018 ? Ileostomy closure 04/02/2018 2.Distal sigmoid polyp on colonoscopy 10/03/2017-invasive adenocarcinoma involving a tubular adenoma, invasive carcinoma measured 1.1 cm with no lymphovascular space involvement and negative resection margin  Flexible sigmoidoscopy 10/08/2017-no residual polyp identified, area of previous polyp at 22 cm tattooed  3.Admission 11/04/2017 with perforated diverticulitis. Follow-up CT 11/08/2017 with no significant change in appearance of sigmoid diverticulitis and adjacent contained perforation. No evidence of drainable abscess, free air or bowel obstruction.  4.Gout-left first MTP 02/23/2018-prescribed short course of indomethacin    Disposition: Jeffrey Nolan is recovering from the ileostomy closure procedure.  The ileostomy wound remains open, but does not appear infected.  He will seek medical attention for a fever or erythema at the wound.  We discussed the indication for continuing adjuvant capecitabine.  I recommend completing 3-4 additional cycles of capecitabine.  He does not wish to receive further capecitabine.  Jeffrey Nolan plans to follow-up with Dr. Drue Flirt on 05/05/2018.  He will return for an office visit and CEA in approximately 5 months.  Betsy Coder, MD  04/15/2018  9:04 AM

## 2018-04-15 NOTE — Telephone Encounter (Signed)
Scheduled appt pe r6/26 los - sent reminder letter in the mail with appt date and time.

## 2018-06-01 ENCOUNTER — Telehealth: Payer: Self-pay | Admitting: Internal Medicine

## 2018-06-01 NOTE — Telephone Encounter (Signed)
Patient had ileostomy closure in June. He is calling to see when/if he needs to see Dr. Hilarie Fredrickson. States he is doing well just having issues getting bowels regulated. He states when the stool gets about 2 feet down in colon he has to run to the bathroom because he feels a lot of pressure and the stool will just come out. States he is taking 2 Imodium in the am and one in the afternoon to help with this but then he has issues with constipation. Please advise.

## 2018-06-01 NOTE — Telephone Encounter (Signed)
Sounds as if constipation may be predominant I would have him stop Imodium May also need MiraLAX 17 g daily Schedule follow-up office visit for altered bowel habit Will need colonoscopy this December

## 2018-06-02 MED ORDER — CHOLESTYRAMINE 4 G PO PACK: 4.0000 g | PACK | Freq: Every day | ORAL | 2 refills | Status: DC

## 2018-06-02 NOTE — Telephone Encounter (Signed)
The concern reading his symptoms raised the question of overflow diarrhea. I would say yes, stop the loperamide. Try cholestyramine 4 g packet once daily to form and bulk stools. With history of rectal cancer and surgery there will be urgency and control issues which we can discuss at office visit Office visit recommended for these symptoms. Could use loperamide if absolutely necessary, but trying to think of other ways to help rather than just "stopping" him up and leading to constipation

## 2018-06-02 NOTE — Telephone Encounter (Signed)
Pt states he has to take the Imodium so he isn't incontinent of stool. He says there is urgency and he cannot hold it. Do you still want him to stop the Imodium?

## 2018-06-02 NOTE — Telephone Encounter (Signed)
Pt scheduled to see Dr. Hilarie Fredrickson 07/29/18@3pm . Pt aware of appt and script sent to pharmacy.

## 2018-06-17 DIAGNOSIS — N400 Enlarged prostate without lower urinary tract symptoms: Secondary | ICD-10-CM | POA: Diagnosis not present

## 2018-06-17 DIAGNOSIS — N538 Other male sexual dysfunction: Secondary | ICD-10-CM | POA: Diagnosis not present

## 2018-06-17 DIAGNOSIS — Z85030 Personal history of malignant carcinoid tumor of large intestine: Secondary | ICD-10-CM | POA: Diagnosis not present

## 2018-06-17 DIAGNOSIS — Z23 Encounter for immunization: Secondary | ICD-10-CM | POA: Diagnosis not present

## 2018-06-17 DIAGNOSIS — N3000 Acute cystitis without hematuria: Secondary | ICD-10-CM | POA: Diagnosis not present

## 2018-06-17 DIAGNOSIS — E291 Testicular hypofunction: Secondary | ICD-10-CM | POA: Diagnosis not present

## 2018-06-17 DIAGNOSIS — Z0000 Encounter for general adult medical examination without abnormal findings: Secondary | ICD-10-CM | POA: Diagnosis not present

## 2018-07-17 ENCOUNTER — Encounter: Payer: Self-pay | Admitting: *Deleted

## 2018-07-29 ENCOUNTER — Ambulatory Visit: Payer: Federal, State, Local not specified - PPO | Admitting: Internal Medicine

## 2018-08-05 ENCOUNTER — Telehealth: Payer: Self-pay | Admitting: Oncology

## 2018-08-05 NOTE — Telephone Encounter (Signed)
Patient called to reschedule  °

## 2018-08-12 ENCOUNTER — Inpatient Hospital Stay: Payer: Federal, State, Local not specified - PPO | Attending: Oncology | Admitting: Oncology

## 2018-08-12 ENCOUNTER — Inpatient Hospital Stay: Payer: Federal, State, Local not specified - PPO

## 2018-08-12 VITALS — BP 121/71 | HR 64 | Temp 98.0°F | Resp 18 | Ht 74.0 in | Wt 209.0 lb

## 2018-08-12 DIAGNOSIS — Z85048 Personal history of other malignant neoplasm of rectum, rectosigmoid junction, and anus: Secondary | ICD-10-CM | POA: Diagnosis not present

## 2018-08-12 DIAGNOSIS — C20 Malignant neoplasm of rectum: Secondary | ICD-10-CM

## 2018-08-12 NOTE — Progress Notes (Signed)
  [facility] OFFICE PROGRESS NOTE   Diagnosis: Rectal cancer  INTERVAL HISTORY:  Jeffrey Nolan returns as scheduled.  He feels well.  He is exercising.  He will be starting a new job within the next few weeks.  He has irregular bowel habits.  No difficulty with urination.   Objective:  Vital signs in last 24 hours:  Blood pressure 121/71, pulse 64, temperature 98 F (36.7 C), temperature source Oral, resp. rate 18, height 6\' 2"  (1.88 m), weight 209 lb (94.8 kg), SpO2 100 %.    HEENT: Neck without mass Lymphatics: No cervical, supraclavicular, axillary, or inguinal nodes Resp: Lungs clear bilaterally Cardio: Regular rate and rhythm GI: No hepatosplenomegaly, no mass, nontender Vascular: No leg edema  Lab Results:   Lab Results  Component Value Date   CEA1 1.03 03/18/2018     Medications: I have reviewed the patient's current medications.   Assessment/Plan: 1. Rectal cancer-invasive adenocarcinoma confirmed on biopsy of a mid rectal mass 10/03/2017 ? Staging CTs from 10/08/2017, indeterminate liver lesions, no rectal mass identified. No lymphadenopathy. ? MRI abdomen/pelvis 10/20/2017-no suspicious liver lesions, T3 N0 M0 staging with tumor measured at 9 cm from the anal sphincter ? Initiation of Xeloda/radiation 10/27/2017, held beginning 11/05/2017 secondary to diverticulitis;resumed 11/10/2017, completed 12/08/2017 ? Laparoscopic low anterior resection 02/09/2018-diverticulitis/fibrosis, no malignancy identified, 12negative lymph nodes ? Cycle 1 adjuvant Xeloda 03/02/2018 ? Ileostomy closure 04/02/2018 2.Distal sigmoid polyp on colonoscopy 10/03/2017-invasive adenocarcinoma involving a tubular adenoma, invasive carcinoma measured 1.1 cm with no lymphovascular space involvement and negative resection margin  Flexible sigmoidoscopy 10/08/2017-no residual polyp identified, area of previous polyp at 22 cm tattooed  3.Admission 11/04/2017 with  perforated diverticulitis. Follow-up CT 11/08/2017 with no significant change in appearance of sigmoid diverticulitis and adjacent contained perforation. No evidence of drainable abscess, free air or bowel obstruction.  4.Gout-left first MTP 02/23/2018-prescribed short course of indomethacin   Disposition: Jeffrey Nolan remains in clinical remission from rectal cancer.  We will follow-up on the CEA from today.  He will return for an office visit and CEA in 6 months. He would like to defer a surveillance colonoscopy until next year since he is starting a new job.  I will make a referral to Dr. Hilarie Fredrickson.  He does not wish to pursue a pelvic physical therapy appointment at present.  15 minutes were spent with the patient today.  The majority of the time was used for counseling and coordination of care.  Betsy Coder, MD  08/12/2018  3:03 PM

## 2018-08-13 LAB — CEA (IN HOUSE-CHCC): CEA (CHCC-IN HOUSE): 1.1 ng/mL (ref 0.00–5.00)

## 2018-08-20 ENCOUNTER — Encounter: Payer: Self-pay | Admitting: Oncology

## 2018-09-04 ENCOUNTER — Ambulatory Visit: Payer: Federal, State, Local not specified - PPO | Admitting: Internal Medicine

## 2018-09-15 ENCOUNTER — Ambulatory Visit: Payer: Federal, State, Local not specified - PPO | Admitting: Oncology

## 2018-09-16 DIAGNOSIS — J069 Acute upper respiratory infection, unspecified: Secondary | ICD-10-CM | POA: Diagnosis not present

## 2018-10-20 DIAGNOSIS — J069 Acute upper respiratory infection, unspecified: Secondary | ICD-10-CM | POA: Diagnosis not present

## 2018-10-20 DIAGNOSIS — R05 Cough: Secondary | ICD-10-CM | POA: Diagnosis not present

## 2018-11-22 ENCOUNTER — Other Ambulatory Visit: Payer: Self-pay | Admitting: Internal Medicine

## 2018-12-02 ENCOUNTER — Other Ambulatory Visit: Payer: Self-pay | Admitting: Internal Medicine

## 2018-12-07 ENCOUNTER — Encounter: Payer: Self-pay | Admitting: Internal Medicine

## 2018-12-11 ENCOUNTER — Telehealth: Payer: Self-pay | Admitting: *Deleted

## 2018-12-11 ENCOUNTER — Ambulatory Visit (AMBULATORY_SURGERY_CENTER): Payer: Self-pay | Admitting: *Deleted

## 2018-12-11 ENCOUNTER — Encounter: Payer: Self-pay | Admitting: Internal Medicine

## 2018-12-11 VITALS — Ht 74.0 in | Wt 208.0 lb

## 2018-12-11 DIAGNOSIS — Z85038 Personal history of other malignant neoplasm of large intestine: Secondary | ICD-10-CM

## 2018-12-11 MED ORDER — NA SULFATE-K SULFATE-MG SULF 17.5-3.13-1.6 GM/177ML PO SOLN: ORAL | 0 refills | Status: DC

## 2018-12-11 MED ORDER — CHOLESTYRAMINE 4 G PO PACK: 4.0000 g | PACK | Freq: Every day | ORAL | 2 refills | Status: DC

## 2018-12-11 NOTE — Telephone Encounter (Signed)
Yes, ok to resume cholestyramine 4 g daily Should hold this for several days before colon prep Thanks JMP

## 2018-12-11 NOTE — Progress Notes (Signed)
Patient denies any allergies to eggs or soy. Patient denies any problems with anesthesia/sedation. Patient denies any oxygen use at home. Patient denies taking any diet/weight loss medications or blood thinners. EMMI education offered, pt declined.  

## 2018-12-11 NOTE — Telephone Encounter (Signed)
Patient had pv today for his upcoming recall colonoscopy on 12/24/2018 1 year after rectal ca surgery. He states he has been taking Cholestyramine 4 g packet daily since his surgery but has been out of this medication for 2 weeks. Since he has not been on this he has been having frequent loose BM's and takes Imodium every day. He states the Imodium makes his stools hard so he request to be put back on the Cholestyramine. May the patient have a RX for the Cholestyramine 4 g packet daily? Please advise. Thank you, Robbin pv

## 2018-12-11 NOTE — Telephone Encounter (Signed)
Patient called and notified of Dr.Pyrtle's recommendations and that the rx was sent to his pharmacy. Patient is aware not to this or imodium 3 days before colonoscopy prep.

## 2018-12-24 ENCOUNTER — Ambulatory Visit (AMBULATORY_SURGERY_CENTER): Payer: Federal, State, Local not specified - PPO | Admitting: Internal Medicine

## 2018-12-24 ENCOUNTER — Telehealth: Payer: Self-pay | Admitting: Internal Medicine

## 2018-12-24 ENCOUNTER — Encounter: Payer: Self-pay | Admitting: Internal Medicine

## 2018-12-24 VITALS — BP 121/63 | HR 60 | Temp 97.5°F | Resp 15 | Ht 74.0 in | Wt 208.0 lb

## 2018-12-24 DIAGNOSIS — Z1211 Encounter for screening for malignant neoplasm of colon: Secondary | ICD-10-CM | POA: Diagnosis not present

## 2018-12-24 DIAGNOSIS — Z85038 Personal history of other malignant neoplasm of large intestine: Secondary | ICD-10-CM | POA: Diagnosis not present

## 2018-12-24 MED ORDER — HYOSCYAMINE SULFATE 0.125 MG SL SUBL: 0.1250 mg | SUBLINGUAL_TABLET | Freq: Four times a day (QID) | SUBLINGUAL | 3 refills | Status: DC | PRN

## 2018-12-24 MED ORDER — SODIUM CHLORIDE 0.9 % IV SOLN: 500.0000 mL | Freq: Once | INTRAVENOUS | Status: DC

## 2018-12-24 NOTE — Patient Instructions (Addendum)
YOU HAD AN ENDOSCOPIC PROCEDURE TODAY AT [facility]:   Refer to the procedure report that was given to you for any specific questions about what was found during the examination.  If the procedure report does not answer your questions, please call your gastroenterologist to clarify.  If you requested that your care partner not be given the details of your procedure findings, then the procedure report has been included in a sealed envelope for you to review at your convenience later.  YOU SHOULD EXPECT: Some feelings of bloating in the abdomen. Passage of more gas than usual.  Walking can help get rid of the air that was put into your GI tract during the procedure and reduce the bloating. If you had a lower endoscopy (such as a colonoscopy or flexible sigmoidoscopy) you may notice spotting of blood in your stool or on the toilet paper. If you underwent a bowel prep for your procedure, you may not have a normal bowel movement for a few days.  Please Note:  You might notice some irritation and congestion in your nose or some drainage.  This is from the oxygen used during your procedure.  There is no need for concern and it should clear up in a day or so.  SYMPTOMS TO REPORT IMMEDIATELY:   Following lower endoscopy (colonoscopy or flexible sigmoidoscopy):  Excessive amounts of blood in the stool  Significant tenderness or worsening of abdominal pains  Swelling of the abdomen that is new, acute  Fever of 100F or higher    For urgent or emergent issues, a gastroenterologist can be reached at any hour by calling [phone].   DIET:  We do recommend a small meal at first, but then you may proceed to your regular diet.  Drink plenty of fluids but you should avoid alcoholic beverages for 24 hours.  ACTIVITY:  You should plan to take it easy for the rest of today and you should NOT DRIVE or use heavy machinery until tomorrow (because of the sedation medicines used during the test).     FOLLOW UP: Our staff will call the number listed on your records the next business day following your procedure to check on you and address any questions or concerns that you may have regarding the information given to you following your procedure. If we do not reach you, we will leave a message.  However, if you are feeling well and you are not experiencing any problems, there is no need to return our call.  We will assume that you have returned to your regular daily activities without incident.  If any biopsies were taken you will be contacted by phone or by letter within the next 1-3 weeks.  Please call us at [phone] if you have not heard about the biopsies in 3 weeks.    SIGNATURES/CONFIDENTIALITY: You and/or your care partner have signed paperwork which will be entered into your electronic medical record.  These signatures attest to the fact that that the information above on your After Visit Summary has been reviewed and is understood.  Full responsibility of the confidentiality of this discharge information lies with you and/or your care-partner.    Add Benefiber (Over-the-counter) 1 heaping tablespoon daily. Rx was sent to [facility] for [place]. You may resume your current medications today. Repeat colonoscopy in 3 years for surveillance. Please call if any questions or concerns.

## 2018-12-24 NOTE — Progress Notes (Signed)
Pt's states no medical or surgical changes since previsit or office visit. 

## 2018-12-24 NOTE — Progress Notes (Signed)
No problems noted in the recovery room. Maw   Pt given samples of LINZESS 72 mg per Dr. Hilarie Fredrickson. maw

## 2018-12-24 NOTE — Telephone Encounter (Signed)
Arista from pharm called and had questions about the prescription that was sent in

## 2018-12-24 NOTE — Progress Notes (Signed)
PT taken to PACU. Monitors in place. VSS. Report given to RN. 

## 2018-12-24 NOTE — Progress Notes (Signed)
Verbal order from Dr. Hilarie Fredrickson add Benefiber 1 heaping TBSP daily.  Send Levsin 0.125 mg #60 1-2 sublingual q6h prn refill x6 to [facility]

## 2018-12-24 NOTE — Telephone Encounter (Signed)
Advised pharmacist that pt script should be hyoscyamine 1 tablet every 6 hours sublingual prn.

## 2018-12-24 NOTE — Op Note (Signed)
[place] Patient Name: Jeffrey Nolan Procedure Date: 12/24/2018 9:12 AM MRN: [id number] Endoscopist: Jerene Bears , MD Age: 58 Referring MD:  Date of Birth: 09/03/1961 Gender: Male Account #: [id number] Procedure:                Colonoscopy Indications:              High risk colon cancer surveillance: Personal                            history of rectal cancer and sigmoid colon cancer                            arising in a pedunculated polyp, Last colonoscopy:                            December 2018; history of low anterior resection of                            the rectum with loop ileostomy April 2019, closure                            of loop ileostomy June 2019 Medicines:                Monitored Anesthesia Care Procedure:                Pre-Anesthesia Assessment:                           - Prior to the procedure, a History and Physical                            was performed, and patient medications and                            allergies were reviewed. The patient's tolerance of                            previous anesthesia was also reviewed. The risks                            and benefits of the procedure and the sedation                            options and risks were discussed with the patient.                            All questions were answered, and informed consent                            was obtained. Prior Anticoagulants: The patient has                            taken no previous anticoagulant or antiplatelet  agents. ASA Grade Assessment: II - A patient with                            mild systemic disease. After reviewing the risks                            and benefits, the patient was deemed in                            satisfactory condition to undergo the procedure.                           After obtaining informed consent, the colonoscope                            was passed under direct vision.  Throughout the                            procedure, the patient's blood pressure, pulse, and                            oxygen saturations were monitored continuously. The                            Colonoscope was introduced through the anus and                            advanced to the terminal ileum. The terminal ileum,                            ileocecal valve, appendiceal orifice, and rectum                            were photographed. Scope In: 9:19:10 AM Scope Out: 9:31:12 AM Scope Withdrawal Time: 0 hours 9 minutes 3 seconds  Total Procedure Duration: 0 hours 12 minutes 2 seconds  Findings:                 The digital rectal exam was normal.                           The terminal ileum appeared normal.                           There was evidence of a prior end-to-end                            colo-colonic anastomosis in the rectum. This was                            patent and was characterized by healthy appearing                            mucosa and an intact staple line.  The entire examined colon appeared normal.                           Retroflexion in the rectum was not performed due to                            post-surgical anatomy. Complications:            No immediate complications. Estimated Blood Loss:     Estimated blood loss: none. Impression:               - The examined portion of the ileum was normal.                           - Patent end-to-end colo-colonic anastomosis,                            characterized by healthy appearing mucosa and an                            intact staple line.                           - The entire examined colon is normal.                           - No specimens collected. Recommendation:           - Patient has a contact number available for                            emergencies. The signs and symptoms of potential                            delayed complications were discussed with the                             patient. Return to normal activities tomorrow.                            Written discharge instructions were provided to the                            patient.                           - Resume previous diet.                           - Continue present medications.                           - Repeat colonoscopy in 3 years for surveillance. Jerene Bears, MD 12/24/2018 9:39:23 AM This report has been signed electronically.

## 2018-12-25 ENCOUNTER — Telehealth: Payer: Self-pay | Admitting: *Deleted

## 2018-12-25 ENCOUNTER — Telehealth: Payer: Self-pay

## 2018-12-25 NOTE — Telephone Encounter (Signed)
Left message on f/u call 

## 2018-12-25 NOTE — Telephone Encounter (Signed)
  Follow up Call-  Call back number 12/24/2018 10/08/2017 10/03/2017  Post procedure Call Back phone  # [phone]  Permission to leave phone message Yes Yes Yes     Patient questions:  Do you have a fever, pain , or abdominal swelling? No. Pain Score  0 *  Have you tolerated food without any problems? Yes.    Have you been able to return to your normal activities? Yes.    Do you have any questions about your discharge instructions: Diet   No. Medications  No. Follow up visit  No.  Do you have questions or concerns about your Care? No.  Actions: * If pain score is 4 or above: No action needed, pain <4.

## 2019-01-03 ENCOUNTER — Encounter: Payer: Self-pay | Admitting: Internal Medicine

## 2019-01-29 ENCOUNTER — Telehealth: Payer: Self-pay | Admitting: *Deleted

## 2019-01-29 NOTE — Telephone Encounter (Signed)
MD suggests moving lab/OV out a month or offer [place] visit on 02/05/19 at same time. Left VM for patient to call with his preference.

## 2019-02-01 ENCOUNTER — Encounter: Payer: Self-pay | Admitting: Oncology

## 2019-02-05 ENCOUNTER — Other Ambulatory Visit: Payer: Federal, State, Local not specified - PPO

## 2019-02-05 ENCOUNTER — Ambulatory Visit: Payer: Federal, State, Local not specified - PPO | Admitting: Oncology

## 2019-02-19 ENCOUNTER — Encounter: Payer: Self-pay | Admitting: Oncology

## 2019-03-03 ENCOUNTER — Encounter: Payer: Self-pay | Admitting: Oncology

## 2019-03-05 ENCOUNTER — Other Ambulatory Visit: Payer: Self-pay

## 2019-03-05 ENCOUNTER — Other Ambulatory Visit: Payer: Federal, State, Local not specified - PPO

## 2019-03-05 ENCOUNTER — Inpatient Hospital Stay: Payer: Federal, State, Local not specified - PPO | Attending: Oncology

## 2019-03-05 DIAGNOSIS — C20 Malignant neoplasm of rectum: Secondary | ICD-10-CM | POA: Insufficient documentation

## 2019-03-05 LAB — CEA (IN HOUSE-CHCC): CEA (CHCC-In House): 1.42 ng/mL (ref 0.00–5.00)

## 2019-03-16 DIAGNOSIS — H6123 Impacted cerumen, bilateral: Secondary | ICD-10-CM | POA: Diagnosis not present

## 2019-03-16 DIAGNOSIS — H6500 Acute serous otitis media, unspecified ear: Secondary | ICD-10-CM | POA: Diagnosis not present

## 2019-03-18 ENCOUNTER — Telehealth: Payer: Self-pay | Admitting: Oncology

## 2019-03-18 NOTE — Telephone Encounter (Signed)
Left message to verify webex appt for pre reg

## 2019-03-19 ENCOUNTER — Inpatient Hospital Stay (HOSPITAL_BASED_OUTPATIENT_CLINIC_OR_DEPARTMENT_OTHER): Payer: Federal, State, Local not specified - PPO | Admitting: Oncology

## 2019-03-19 DIAGNOSIS — C20 Malignant neoplasm of rectum: Secondary | ICD-10-CM

## 2019-03-19 NOTE — Progress Notes (Signed)
  Jeffrey Nolan OFFICE VISIT PROGRESS NOTE  I connected with Jeffrey Nolan on 03/19/19 at  9:00 AM EDT by  and verified that I am speaking with the correct person using two identifiers.    Patient's location: Home Provider's location: Office   Diagnosis: Rectal cancer  INTERVAL HISTORY:   Jeffrey Nolan is seen today for a telephone visit.  He consented to a telephone visit in lieu of an in person visit.  This is due to the [place] pandemic. [name] reports feeling very well.  He is exercising regularly.  He is working.  He continues to have irregular bowel habits with urgency after eating.  He has occasional small amounts of incontinence.  No bleeding. A colonoscopy on 12/24/2018 was negative.  Lab Results: CEA on 515 2020-1 0.42   Medications: I have reviewed the patient's current medications.  Assessment/Plan: 1. Rectal cancer-invasive adenocarcinoma confirmed on biopsy of a mid rectal mass 10/03/2017 ? Staging CTs from 10/08/2017, indeterminate liver lesions, no rectal mass identified. No lymphadenopathy. ? MRI abdomen/pelvis 10/20/2017-no suspicious liver lesions, T3 N0 M0 staging with tumor measured at 9 cm from the anal sphincter ? Initiation of Xeloda/radiation 10/27/2017, held beginning 11/05/2017 secondary to diverticulitis;resumed 11/10/2017, completed 12/08/2017 ? Laparoscopic low anterior resection 02/09/2018-diverticulitis/fibrosis, no malignancy identified, 12negative lymph nodes ? Cycle 1 adjuvant Xeloda 03/02/2018 ? Ileostomy closure 04/02/2018 ? Negative colonoscopy 12/24/2018 2.Distal sigmoid polyp on colonoscopy 10/03/2017-invasive adenocarcinoma involving a tubular adenoma, invasive carcinoma measured 1.1 cm with no lymphovascular space involvement and negative resection margin  Flexible sigmoidoscopy 10/08/2017-no residual polyp identified, area of previous polyp at 22 cm tattooed  3.Admission 11/04/2017 with  perforated diverticulitis. Follow-up CT 11/08/2017 with no significant change in appearance of sigmoid diverticulitis and adjacent contained perforation. No evidence of drainable abscess, free air or bowel obstruction.  4.Gout-left first MTP 02/23/2018-prescribedshort course of indomethacin    Disposition: Jeffrey Nolan is in clinical remission from rectal cancer.  He does not wish to pursue pelvic physical therapy.  He will return for office visit and CEA in 6 months.  He will contact us in the interim as needed.   I discussed the assessment and treatment plan with the patient. The patient was provided an opportunity to ask questions and all were answered. The patient agreed with the plan and demonstrated an understanding of the instructions.   The patient was advised to call back or seek an in-person evaluation if the symptoms worsen or if the condition fails to improve as anticipated.  I provided 15 minutes of telephone, chart review, and documentation time during this encounter, and > 50% was spent counseling as documented under my assessment & plan.  Betsy Coder ANP/GNP-BC   03/19/2019 9:05 AM

## 2019-03-22 ENCOUNTER — Ambulatory Visit: Payer: Federal, State, Local not specified - PPO | Admitting: Oncology

## 2019-03-22 ENCOUNTER — Telehealth: Payer: Self-pay | Admitting: Oncology

## 2019-03-22 NOTE — Telephone Encounter (Signed)
Scheduled appt per 5/29 los.  A calendar will be mailed out.

## 2019-03-24 IMAGING — CT CT ABD-PELV W/ CM
2 of 5 series · 16 of 46 positions shown, 18 images · IV contrast (iopamidol)
Comparison: Pelvic MRI dated 10/20/2017 and abdominal CT dated
10/08/2017

CLINICAL DATA: 56-year-old male with abdominal pain. History of
rectal adenocarcinoma.

EXAM:
CT ABDOMEN AND PELVIS WITH CONTRAST
TECHNIQUE: Multidetector CT imaging of the abdomen and pelvis was performed
using the standard protocol following bolus administration of
intravenous contrast.
CONTRAST:  100mL [id number] IOPAMIDOL ([id number]-B22) INJECTION 61%

[Series 2: axial st · axial · 0.74mm/px · z∈[+1209,+1624]mm · 13 of 97 slices shown, 15 images]
[im 7/97  soft-tissue]
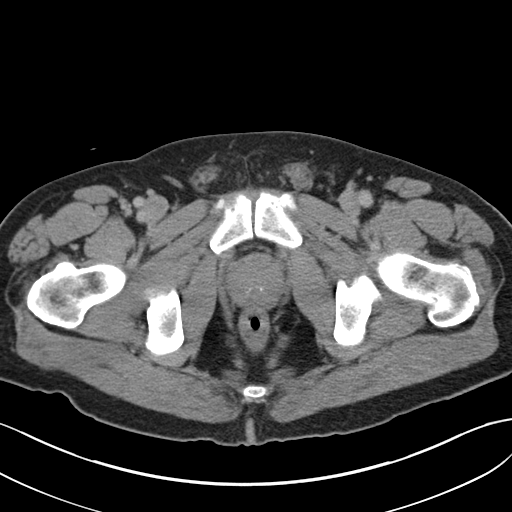
[im 7/97  bone]
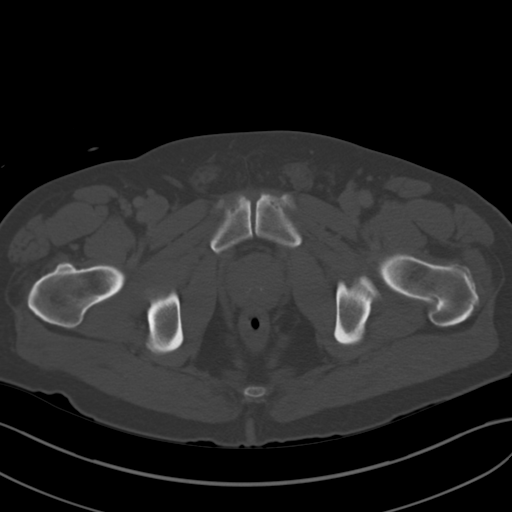
[im 13/97  soft-tissue]
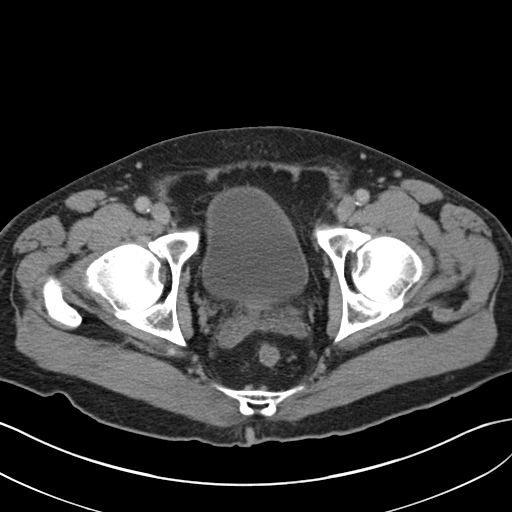
[im 20/97  soft-tissue]
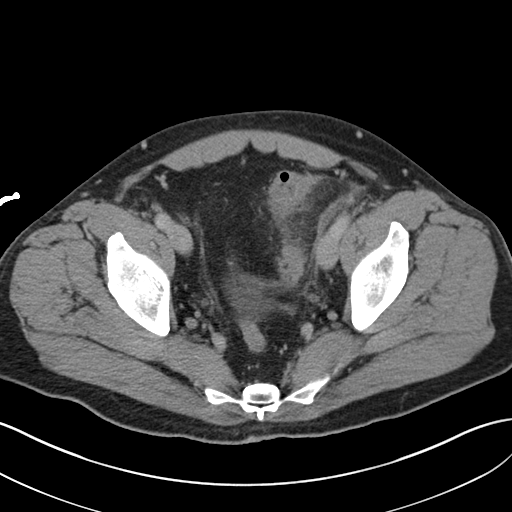
[im 26/97  soft-tissue]
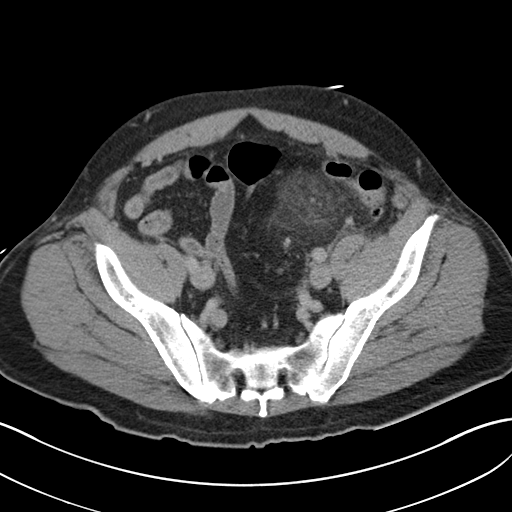
[im 33/97  soft-tissue]
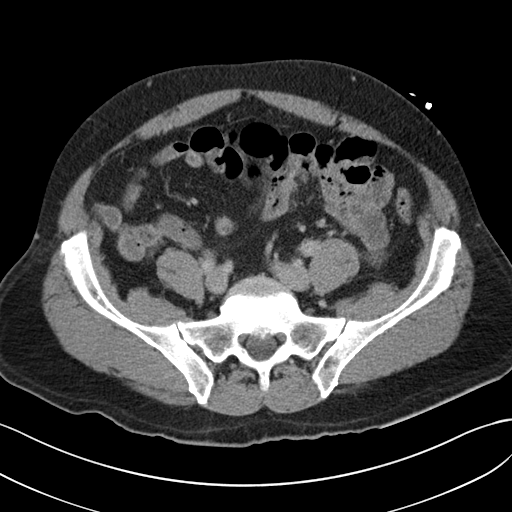
[im 39/97  soft-tissue]
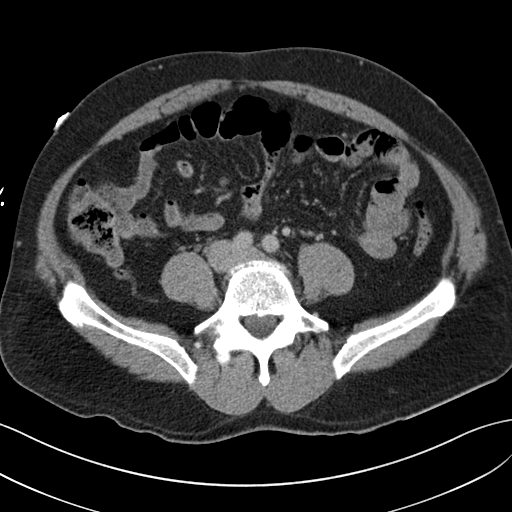
[im 52/97  soft-tissue]
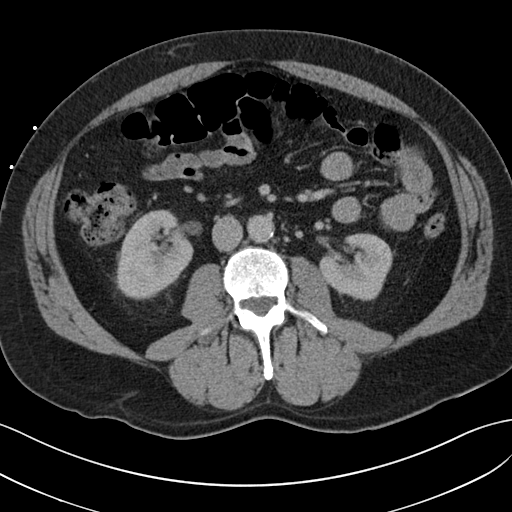
[im 58/97  soft-tissue]
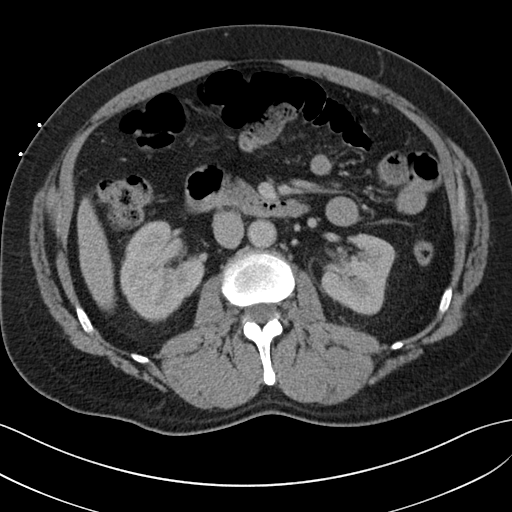
[im 65/97  soft-tissue]
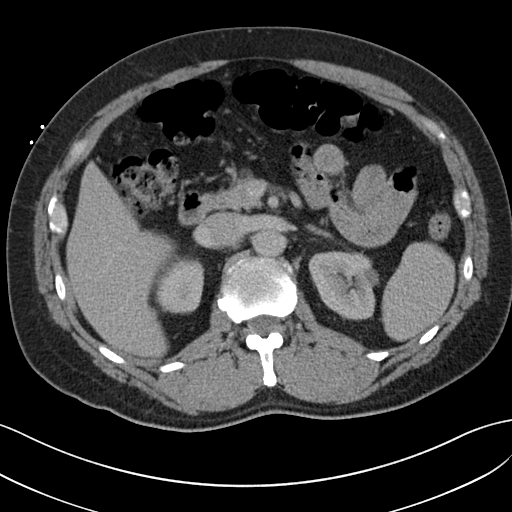
[im 65/97  bone]
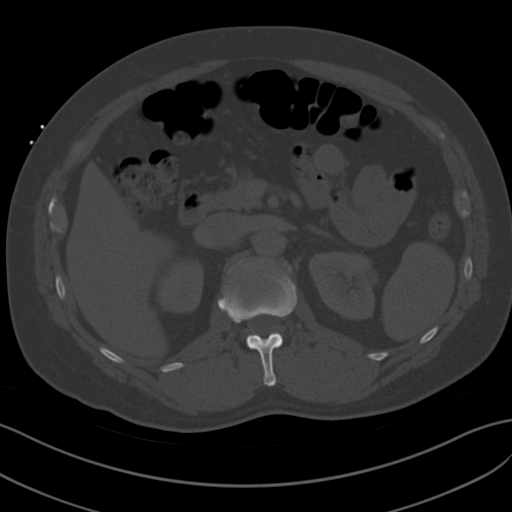
[im 71/97  soft-tissue]
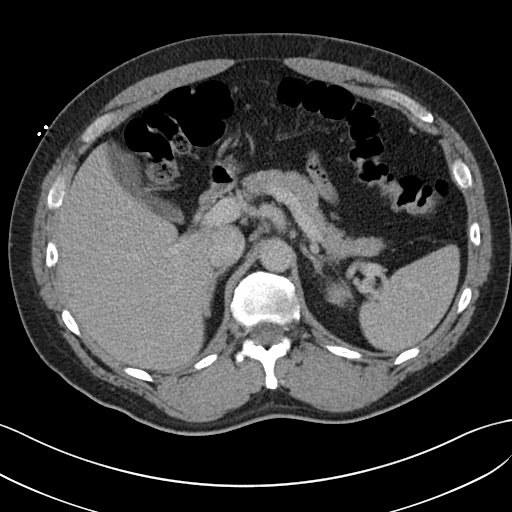
[im 77/97  soft-tissue]
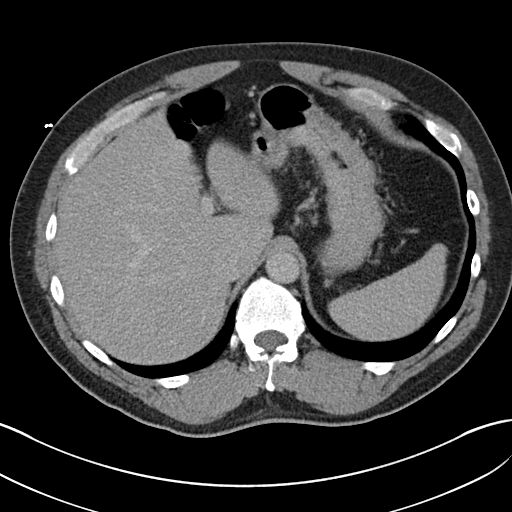
[im 84/97  soft-tissue]
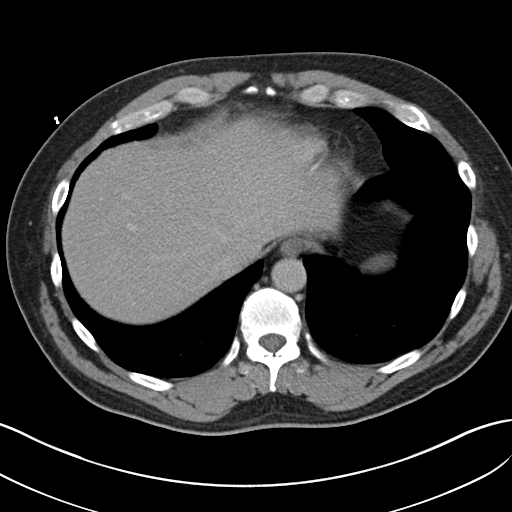
[im 90/97  soft-tissue]
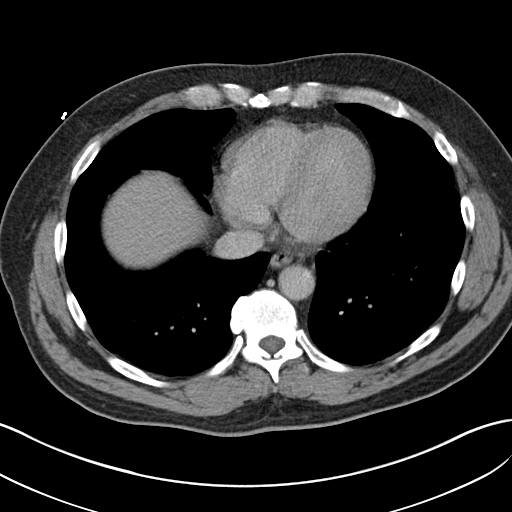

[Series 3: coronal st · coronal · 0.79mm/px · 3 of 100 slices shown]
[im 34/100  soft-tissue]
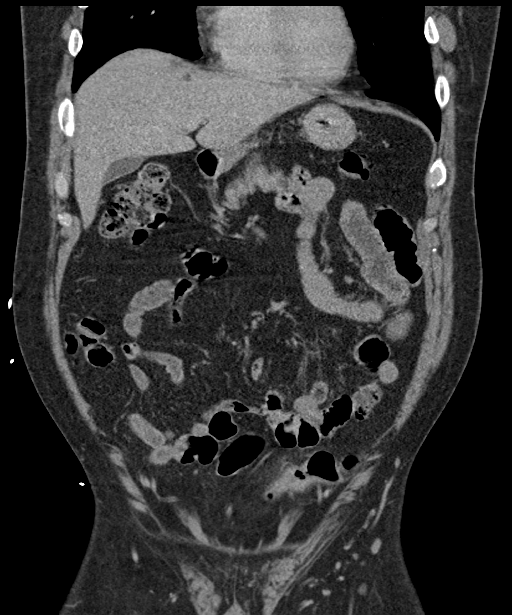
[im 45/100  soft-tissue]
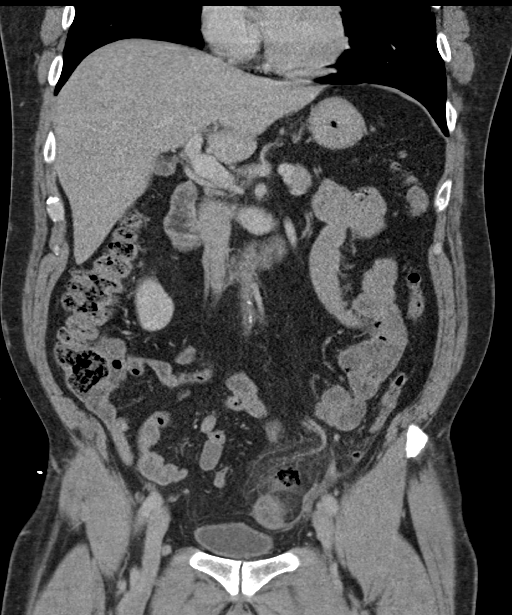
[im 56/100  soft-tissue]
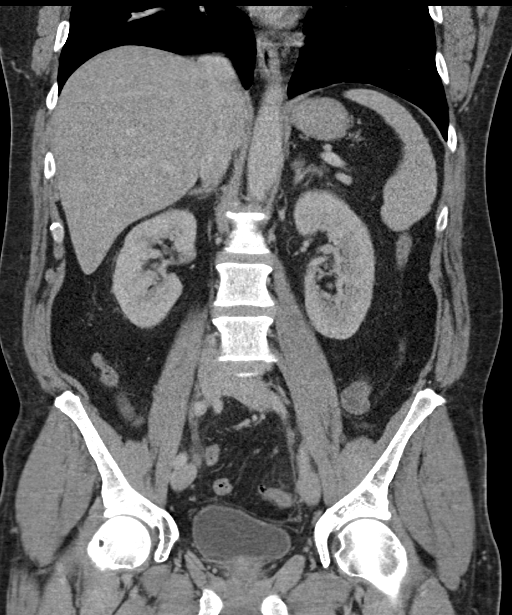

[16 of 46 positions shown; findings below may reference images not displayed]

FINDINGS: Lower chest: The visualized lung bases are clear. There is a small
calcified granuloma in the right posterior costophrenic angle.

No intra-abdominal free air or free fluid.

Hepatobiliary: Probable mild fatty infiltration of the liver. Small
scattered hepatic hypodense lesions, not characterized on this CT
corresponding to the cysts noted the prior MRI. These are stable
compared to the CT of 10/08/2017. No intrahepatic biliary ductal
dilatation. The gallbladder is unremarkable.

Pancreas: Unremarkable. No pancreatic ductal dilatation or
surrounding inflammatory changes.

Spleen: Normal in size without focal abnormality.

Adrenals/Urinary Tract: The adrenal glands are unremarkable. Left
renal cysts better seen on the prior MRI. The kidneys are otherwise
unremarkable. There is symmetric enhancement and excretion of
contrast by both kidneys. The visualized ureters and urinary bladder
appear unremarkable.

Stomach/Bowel: There is sigmoid diverticulosis without active
inflammatory changes compatible with acute diverticulitis. Focally
contained extraluminal air adjacent to the sigmoid colon. No
diverticular abscess. No bowel obstruction. The known rectal mass is
not evaluated on today's exam. No perirectal adenopathy. Normal
appendix.

Vascular/Lymphatic: Mild aortoiliac atherosclerotic disease. The
abdominal aorta and IVC are otherwise unremarkable. No portal venous
gas. There is no adenopathy.

Reproductive: The prostate and seminal vesicles are grossly
unremarkable.

Other: Small fat containing umbilical hernia.  No fluid collection.

Musculoskeletal: Grade 1 L4-L5 anterolisthesis. No acute osseous
pathology.
IMPRESSION: 1. Sigmoid diverticulitis with focally contained diverticular
perforation. No abscess.
2. No bowel obstruction.  Normal appendix.
3. Small hepatic and left renal cysts.
4.  Aortic Atherosclerosis ([id number].J).

These results were called by telephone at the time of interpretation
on 11/05/2017 at [DATE] to Dr. DAMIANNO USS , who verbally
acknowledged these results.

## 2019-03-24 IMAGING — CR DG CHEST 2V
2 series · 2 of 2 positions shown · non-contrast
Comparison: Chest CT dated 10/08/2017

CLINICAL DATA: 56-year-old male with cough and fever.

EXAM:
CHEST  2 VIEW

[w chest pa]
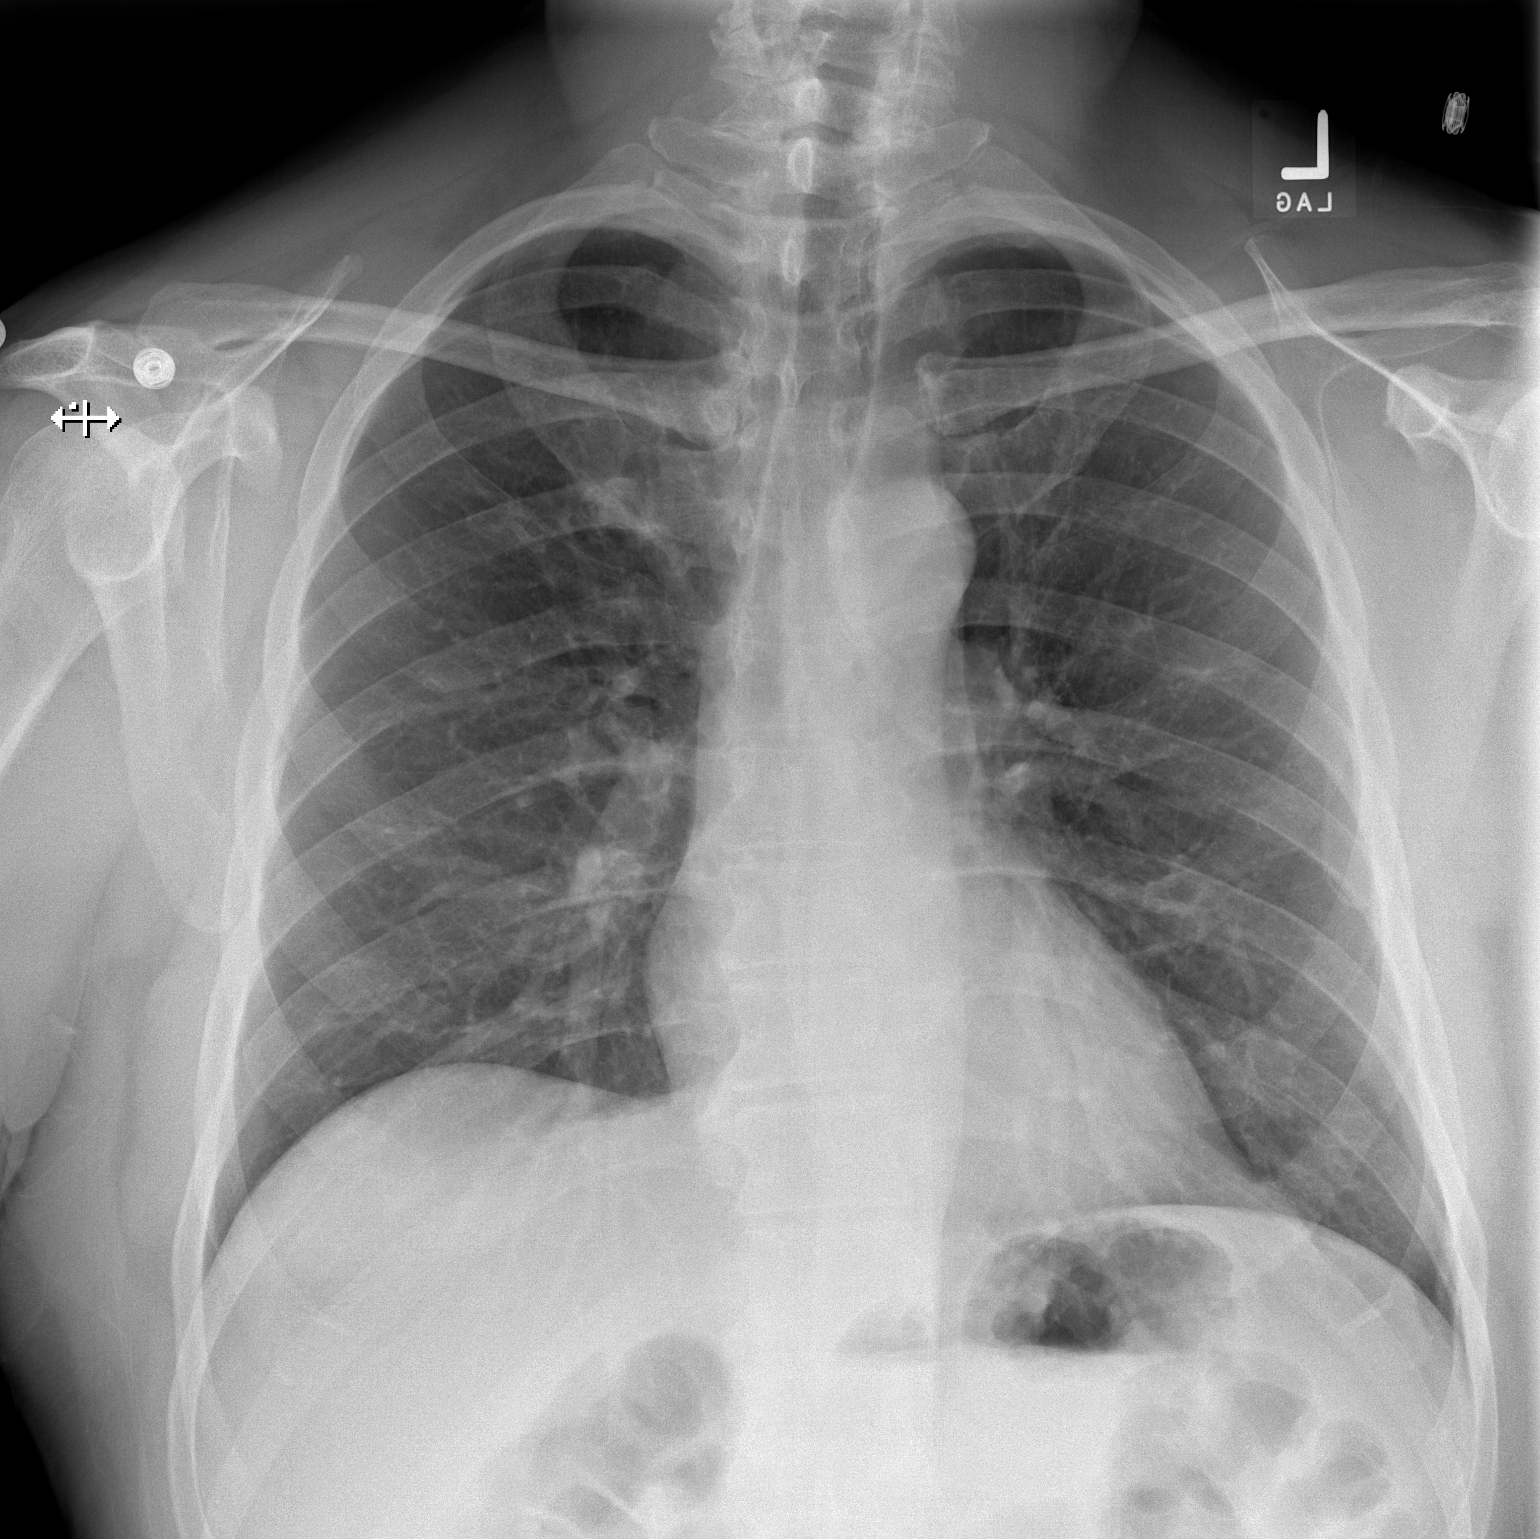

[w chest lat]
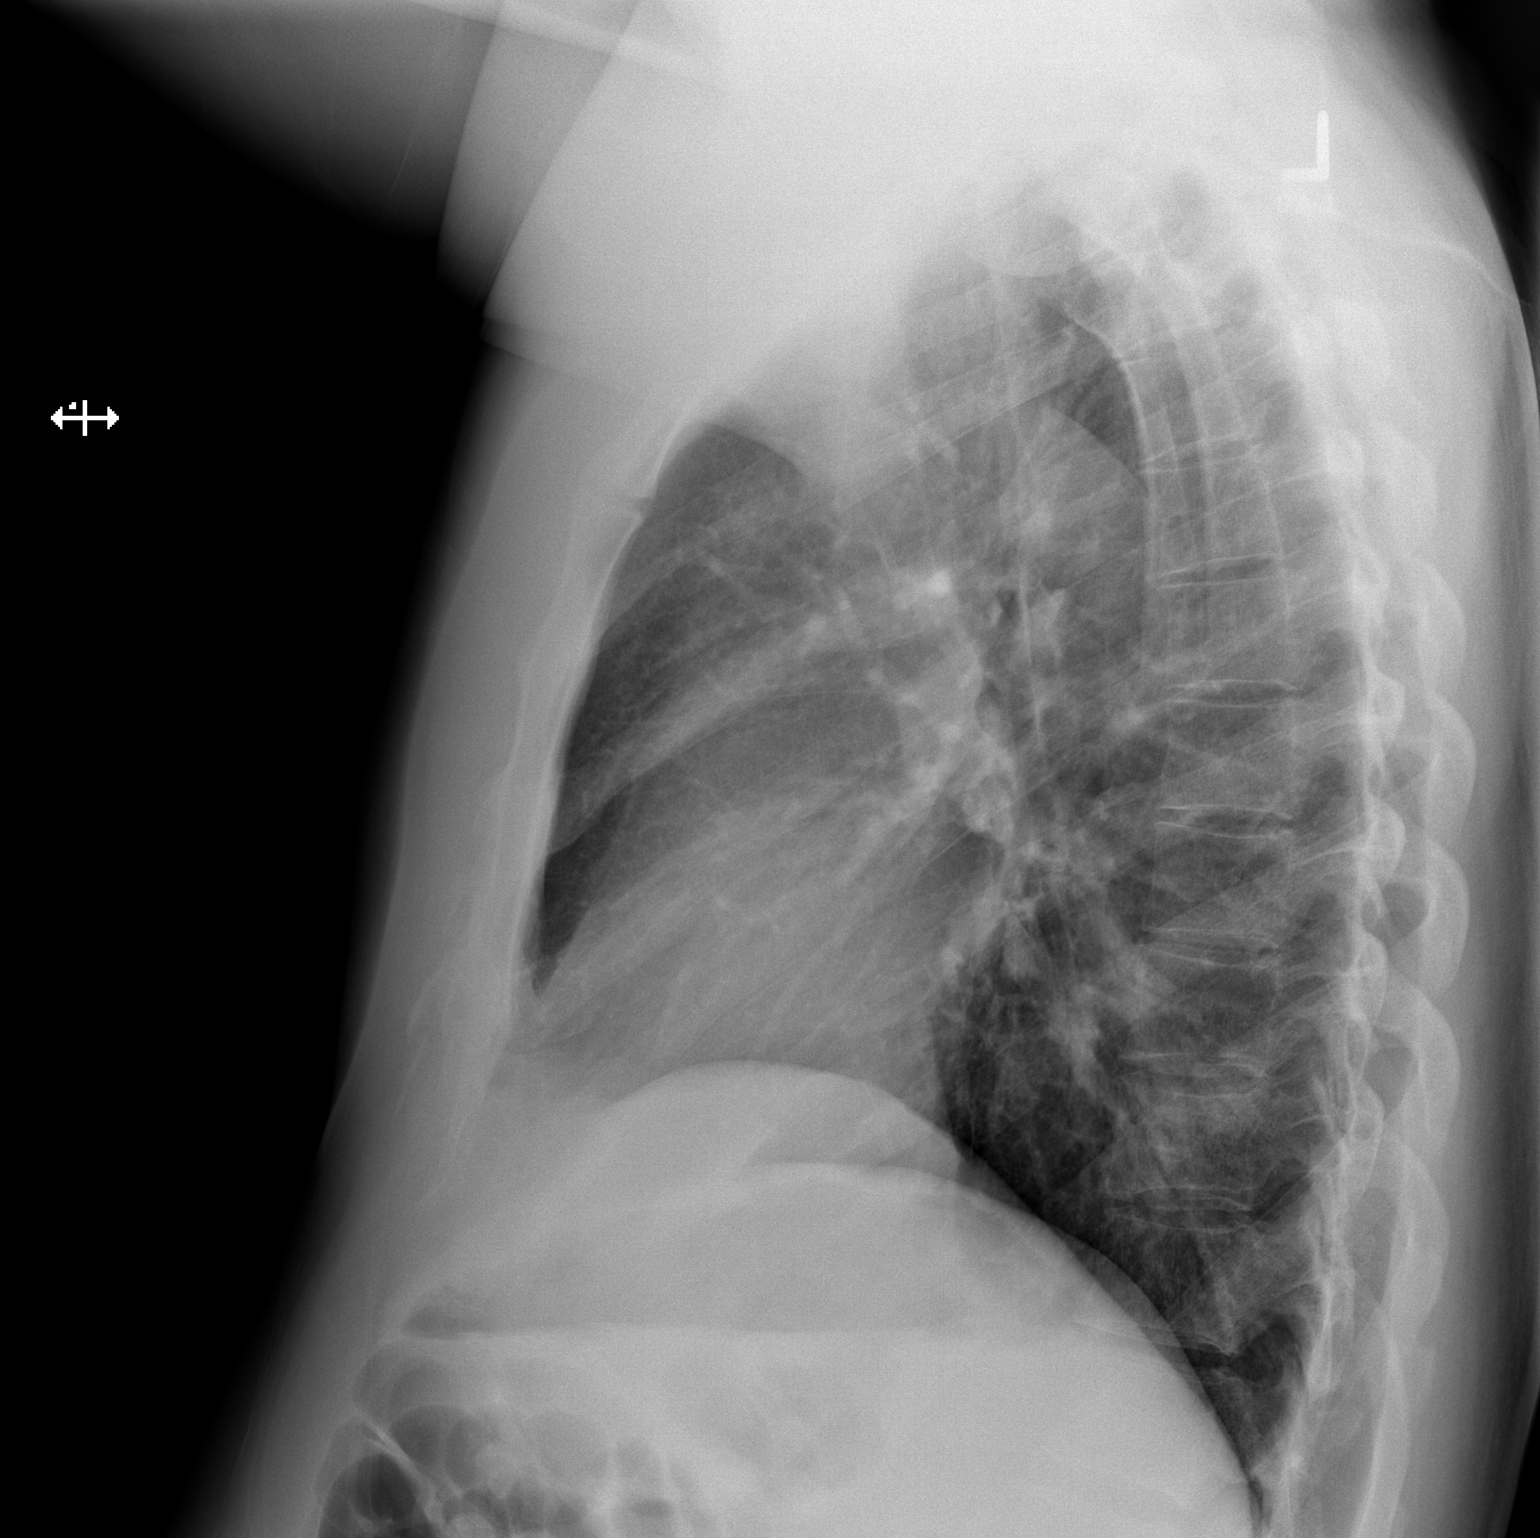

[2 of 2 positions shown; findings below may reference images not displayed]

FINDINGS: The heart size and mediastinal contours are within normal limits.
Both lungs are clear. The visualized skeletal structures are
unremarkable.
IMPRESSION: No active cardiopulmonary disease.

## 2019-08-06 DIAGNOSIS — Z20828 Contact with and (suspected) exposure to other viral communicable diseases: Secondary | ICD-10-CM | POA: Diagnosis not present

## 2019-08-16 ENCOUNTER — Telehealth: Payer: Self-pay | Admitting: Oncology

## 2019-08-16 NOTE — Telephone Encounter (Signed)
Returned patient's phone call regarding rescheduling an appointment, informed patient I will need further assistance from provider and I will be giving him a call back to reschedule.

## 2019-08-18 ENCOUNTER — Telehealth: Payer: Self-pay | Admitting: *Deleted

## 2019-08-18 NOTE — Telephone Encounter (Signed)
Left VM requesting to reschedule his 09/06/19 visit to January 2021. Sent message to scheduler, Rise Paganini that this is OK>

## 2019-08-19 ENCOUNTER — Telehealth: Payer: Self-pay | Admitting: Oncology

## 2019-08-19 NOTE — Telephone Encounter (Signed)
Called patient to reschedule November appointment, left a voicemail.

## 2019-08-19 NOTE — Telephone Encounter (Signed)
Patient called to reschedule November appointments, per patient's request appointment has moved to January. Provider approved.

## 2019-09-06 ENCOUNTER — Other Ambulatory Visit: Payer: Federal, State, Local not specified - PPO

## 2019-09-06 ENCOUNTER — Ambulatory Visit: Payer: Federal, State, Local not specified - PPO | Admitting: Oncology

## 2019-09-08 DIAGNOSIS — Z20828 Contact with and (suspected) exposure to other viral communicable diseases: Secondary | ICD-10-CM | POA: Diagnosis not present

## 2019-09-08 DIAGNOSIS — R05 Cough: Secondary | ICD-10-CM | POA: Diagnosis not present

## 2019-09-28 DIAGNOSIS — Z1159 Encounter for screening for other viral diseases: Secondary | ICD-10-CM | POA: Diagnosis not present

## 2019-10-04 DIAGNOSIS — R0981 Nasal congestion: Secondary | ICD-10-CM | POA: Diagnosis not present

## 2019-10-05 DIAGNOSIS — M791 Myalgia, unspecified site: Secondary | ICD-10-CM | POA: Diagnosis not present

## 2019-10-05 DIAGNOSIS — R0981 Nasal congestion: Secondary | ICD-10-CM | POA: Diagnosis not present

## 2019-10-05 DIAGNOSIS — N521 Erectile dysfunction due to diseases classified elsewhere: Secondary | ICD-10-CM | POA: Diagnosis not present

## 2019-10-05 DIAGNOSIS — Z1159 Encounter for screening for other viral diseases: Secondary | ICD-10-CM | POA: Diagnosis not present

## 2019-10-05 DIAGNOSIS — J069 Acute upper respiratory infection, unspecified: Secondary | ICD-10-CM | POA: Diagnosis not present

## 2019-10-05 DIAGNOSIS — F5221 Male erectile disorder: Secondary | ICD-10-CM | POA: Diagnosis not present

## 2019-10-05 DIAGNOSIS — Z20828 Contact with and (suspected) exposure to other viral communicable diseases: Secondary | ICD-10-CM | POA: Diagnosis not present

## 2019-10-11 DIAGNOSIS — Z1159 Encounter for screening for other viral diseases: Secondary | ICD-10-CM | POA: Diagnosis not present

## 2019-10-11 DIAGNOSIS — J180 Bronchopneumonia, unspecified organism: Secondary | ICD-10-CM | POA: Diagnosis not present

## 2019-10-12 DIAGNOSIS — Z1159 Encounter for screening for other viral diseases: Secondary | ICD-10-CM | POA: Diagnosis not present

## 2019-10-25 ENCOUNTER — Other Ambulatory Visit: Payer: Self-pay

## 2019-10-25 ENCOUNTER — Inpatient Hospital Stay (HOSPITAL_BASED_OUTPATIENT_CLINIC_OR_DEPARTMENT_OTHER): Payer: Federal, State, Local not specified - PPO | Admitting: Oncology

## 2019-10-25 ENCOUNTER — Inpatient Hospital Stay: Payer: Federal, State, Local not specified - PPO | Attending: Oncology

## 2019-10-25 VITALS — BP 138/90 | HR 75 | Temp 98.5°F | Resp 16 | Ht 74.0 in | Wt 218.2 lb

## 2019-10-25 DIAGNOSIS — C20 Malignant neoplasm of rectum: Secondary | ICD-10-CM

## 2019-10-25 DIAGNOSIS — Z85048 Personal history of other malignant neoplasm of rectum, rectosigmoid junction, and anus: Secondary | ICD-10-CM | POA: Insufficient documentation

## 2019-10-25 LAB — CEA (IN HOUSE-CHCC): CEA (CHCC-In House): 1 ng/mL (ref 0.00–5.00)

## 2019-10-25 NOTE — Progress Notes (Signed)
  Jeffrey Nolan   Diagnosis: Rectal cancer  INTERVAL HISTORY:   Jeffrey Nolan returns as scheduled.  He feels well.  Good appetite and energy level.  He is working.  He exercises.  His bowel function has become more regular since starting Linzess.  No difficulty with bowel control.  Objective:  Vital signs in last 24 hours:  Blood pressure 138/90, pulse 75, temperature 98.5 F (36.9 C), temperature source Temporal, resp. rate 16, height 6\' 2"  (1.88 m), weight 218 lb 3.2 oz (99 kg), SpO2 99 %.    HEENT: Neck without mass Lymphatics: No cervical, supraclavicular, axillary, or inguinal nodes GI: No hepatosplenomegaly, no mass, nontender Vascular: No leg edema   Lab Results:  Lab Results  Component Value Date   WBC 4.1 03/18/2018   HGB 13.7 03/18/2018   HCT 40.7 03/18/2018   MCV 87.9 03/18/2018   PLT 229 03/18/2018   NEUTROABS 3.2 03/18/2018    CMP  Lab Results  Component Value Date   NA 135 (L) 03/18/2018   K 4.4 03/18/2018   CL 99 03/18/2018   CO2 23 03/18/2018   GLUCOSE 104 03/18/2018   BUN 14 03/18/2018   CREATININE 1.18 03/18/2018   CALCIUM 9.7 03/18/2018   PROT 7.4 03/18/2018   ALBUMIN 4.2 03/18/2018   AST 17 03/18/2018   ALT 21 03/18/2018   ALKPHOS 67 03/18/2018   BILITOT 0.3 03/18/2018   GFRNONAA >60 03/18/2018   GFRAA >60 03/18/2018    Lab Results  Component Value Date   CEA1 1.42 03/05/2019    Medications: I have reviewed the patient's current medications.   Assessment/Plan: 1. Rectal cancer-invasive adenocarcinoma confirmed on biopsy of a mid rectal mass 10/03/2017 ? Staging CTs from 10/08/2017, indeterminate liver lesions, no rectal mass identified. No lymphadenopathy. ? MRI abdomen/pelvis 10/20/2017-no suspicious liver lesions, T3 N0 M0 staging with tumor measured at 9 cm from the anal sphincter ? Initiation of Xeloda/radiation 10/27/2017, held beginning 11/05/2017 secondary to diverticulitis;resumed  11/10/2017, completed 12/08/2017 ? Laparoscopic low anterior resection 02/09/2018-diverticulitis/fibrosis, no malignancy identified, 12negative lymph nodes ? Cycle 1 adjuvant Xeloda 03/02/2018 ? Ileostomy closure 04/02/2018 ? Negative colonoscopy 12/24/2018 2.Distal sigmoid polyp on colonoscopy 10/03/2017-invasive adenocarcinoma involving a tubular adenoma, invasive carcinoma measured 1.1 cm with no lymphovascular space involvement and negative resection margin  Flexible sigmoidoscopy 10/08/2017-no residual polyp identified, area of previous polyp at 22 cm tattooed  3.Admission 11/04/2017 with perforated diverticulitis. Follow-up CT 11/08/2017 with no significant change in appearance of sigmoid diverticulitis and adjacent contained perforation. No evidence of drainable abscess, free air or bowel obstruction.  4.Gout-left first MTP 02/23/2018-prescribedshort course of indomethacin    Disposition: Jeffrey Nolan is in remission from rectal cancer.  We will follow up on the CEA from today.  He will return for an office visit and CEA in 6 months.  Betsy Coder, MD  10/25/2019  1:24 PM

## 2019-10-27 ENCOUNTER — Telehealth: Payer: Self-pay | Admitting: Oncology

## 2019-10-27 DIAGNOSIS — F5221 Male erectile disorder: Secondary | ICD-10-CM | POA: Diagnosis not present

## 2019-10-27 DIAGNOSIS — Z1159 Encounter for screening for other viral diseases: Secondary | ICD-10-CM | POA: Diagnosis not present

## 2019-10-27 NOTE — Telephone Encounter (Signed)
Scheduled per los. Called and left msg. mailed printout  

## 2019-10-29 DIAGNOSIS — Z1159 Encounter for screening for other viral diseases: Secondary | ICD-10-CM | POA: Diagnosis not present

## 2019-11-09 DIAGNOSIS — Z1159 Encounter for screening for other viral diseases: Secondary | ICD-10-CM | POA: Diagnosis not present

## 2019-11-24 DIAGNOSIS — Z1159 Encounter for screening for other viral diseases: Secondary | ICD-10-CM | POA: Diagnosis not present

## 2019-12-04 DIAGNOSIS — M67823 Other specified disorders of tendon, right elbow: Secondary | ICD-10-CM | POA: Diagnosis not present

## 2019-12-04 DIAGNOSIS — M79621 Pain in right upper arm: Secondary | ICD-10-CM | POA: Diagnosis not present

## 2019-12-07 DIAGNOSIS — Z1159 Encounter for screening for other viral diseases: Secondary | ICD-10-CM | POA: Diagnosis not present

## 2019-12-21 DIAGNOSIS — Z1159 Encounter for screening for other viral diseases: Secondary | ICD-10-CM | POA: Diagnosis not present

## 2020-01-04 DIAGNOSIS — Z1159 Encounter for screening for other viral diseases: Secondary | ICD-10-CM | POA: Diagnosis not present

## 2020-01-20 ENCOUNTER — Telehealth: Payer: Self-pay | Admitting: Internal Medicine

## 2020-01-20 MED ORDER — CIPROFLOXACIN HCL 500 MG PO TABS: 500.0000 mg | ORAL_TABLET | Freq: Two times a day (BID) | ORAL | 0 refills | Status: DC

## 2020-01-20 MED ORDER — METRONIDAZOLE 500 MG PO TABS: 500.0000 mg | ORAL_TABLET | Freq: Two times a day (BID) | ORAL | 0 refills | Status: DC

## 2020-01-20 NOTE — Telephone Encounter (Signed)
Pt states that he has been having sharp pain since the past 2 days. Pain is similar to pain that he felt before he was dx with diverticulitis. He would like some advice. Pls call him.

## 2020-01-20 NOTE — Telephone Encounter (Signed)
Spoke with pt and there are no APP appts available. Scripts sent to pharmacy and pt aware.

## 2020-01-20 NOTE — Telephone Encounter (Signed)
Letter sent to pt via mychart and he is aware.

## 2020-01-20 NOTE — Telephone Encounter (Signed)
Pyrtle pt calling, has had diverticulitis along with colon cancer in the past. Pt states he started having pain around his belly button Tuesday. Reports he was having a lot of pain and bloating. Yesterday he did not get out of the bed, states he had terrible pulling pian when he tried to stand up. He altered his diet and is pushing fluids and feels a little better today but still having some pain. As DOD please advise.

## 2020-01-20 NOTE — Telephone Encounter (Signed)
Pt called back to state that he is having pain in lower left side

## 2020-01-20 NOTE — Telephone Encounter (Signed)
Patient called requesting work note to return on Monday. Send through [employer] or email

## 2020-01-20 NOTE — Telephone Encounter (Signed)
If APP appt available today please come office evaluation. It no appt offer Cipro 500 mg po bid #14 and Flagyl 500 mg po bid, #14 with a full liquid diet. If symptoms not controlled then to ED to evaluate.

## 2020-01-25 ENCOUNTER — Telehealth: Payer: Self-pay | Admitting: Internal Medicine

## 2020-01-25 NOTE — Telephone Encounter (Signed)
Letter written and sent to pt via mychart

## 2020-01-25 NOTE — Telephone Encounter (Signed)
Patient called is asking if the letter can be amended to reflect 4/6 till 01/30/20

## 2020-01-25 NOTE — Telephone Encounter (Signed)
Pt states he is doing better with diverticulitis but he states one of the meds makes him drowsy. He is requesting a letter to be out of work 4/7 and 4/8 and he is off 4/8-4/11. Let pt know the meds should not make him drowsy but he states one of them does. Note written for 4/7 and 4/8 only. Pt states that he did not need a note for today that was an error. Note sent via mychart.

## 2020-04-13 ENCOUNTER — Inpatient Hospital Stay: Payer: Federal, State, Local not specified - PPO

## 2020-04-13 ENCOUNTER — Other Ambulatory Visit: Payer: Self-pay

## 2020-04-13 ENCOUNTER — Inpatient Hospital Stay: Payer: Federal, State, Local not specified - PPO | Attending: Oncology | Admitting: Oncology

## 2020-04-13 VITALS — BP 136/88 | HR 73 | Temp 97.7°F | Resp 18 | Ht 74.0 in | Wt 220.2 lb

## 2020-04-13 DIAGNOSIS — C20 Malignant neoplasm of rectum: Secondary | ICD-10-CM

## 2020-04-13 DIAGNOSIS — Z85038 Personal history of other malignant neoplasm of large intestine: Secondary | ICD-10-CM | POA: Insufficient documentation

## 2020-04-13 LAB — CEA (IN HOUSE-CHCC): CEA (CHCC-In House): 1.47 ng/mL (ref 0.00–5.00)

## 2020-04-13 NOTE — Progress Notes (Signed)
  [facility] OFFICE PROGRESS NOTE   Diagnosis: Rectal cancer  INTERVAL HISTORY:   Jeffrey Nolan returns for a scheduled visit.  He feels well.  He is working and exercising.  No new complaint.  Good appetite.  He takes Linzess every other day to relieve constipation.  No bleeding.  Objective:  Vital signs in last 24 hours:  Blood pressure 136/88, pulse 73, temperature 97.7 F (36.5 C), temperature source Temporal, resp. rate 18, height 6\' 2"  (1.88 m), weight 220 lb 3.2 oz (99.9 kg), SpO2 99 %.    Lymphatics: No cervical, supraclavicular, axillary, or inguinal nodes Resp: Lungs clear bilaterally Cardio: Regular rate and rhythm GI: No hepatosplenomegaly, nontender, no mass Vascular: No leg edema    Portacath/PICC-without erythema  Lab Results:  Lab Results  Component Value Date   WBC 4.1 03/18/2018   HGB 13.7 03/18/2018   HCT 40.7 03/18/2018   MCV 87.9 03/18/2018   PLT 229 03/18/2018   NEUTROABS 3.2 03/18/2018    CMP  Lab Results  Component Value Date   NA 135 (L) 03/18/2018   K 4.4 03/18/2018   CL 99 03/18/2018   CO2 23 03/18/2018   GLUCOSE 104 03/18/2018   BUN 14 03/18/2018   CREATININE 1.18 03/18/2018   CALCIUM 9.7 03/18/2018   PROT 7.4 03/18/2018   ALBUMIN 4.2 03/18/2018   AST 17 03/18/2018   ALT 21 03/18/2018   ALKPHOS 67 03/18/2018   BILITOT 0.3 03/18/2018   GFRNONAA >60 03/18/2018   GFRAA >60 03/18/2018    Lab Results  Component Value Date   CEA1 1.00 10/25/2019   Medications: I have reviewed the patient's current medications.   Assessment/Plan: 1. Rectal cancer-invasive adenocarcinoma confirmed on biopsy of a mid rectal mass 10/03/2017 ? Staging CTs from 10/08/2017, indeterminate liver lesions, no rectal mass identified. No lymphadenopathy. ? MRI abdomen/pelvis 10/20/2017-no suspicious liver lesions, T3 N0 M0 staging with tumor measured at 9 cm from the anal sphincter ? Initiation of Xeloda/radiation 10/27/2017, held beginning  11/05/2017 secondary to diverticulitis;resumed 11/10/2017, completed 12/08/2017 ? Laparoscopic low anterior resection 02/09/2018-diverticulitis/fibrosis, no malignancy identified, 12negative lymph nodes ? Cycle 1 adjuvant Xeloda 03/02/2018 ? Ileostomy closure 04/02/2018 ? Negative colonoscopy 12/24/2018 2.Distal sigmoid polyp on colonoscopy 10/03/2017-invasive adenocarcinoma involving a tubular adenoma, invasive carcinoma measured 1.1 cm with no lymphovascular space involvement and negative resection margin  Flexible sigmoidoscopy 10/08/2017-no residual polyp identified, area of previous polyp at 22 cm tattooed  3.Admission 11/04/2017 with perforated diverticulitis. Follow-up CT 11/08/2017 with no significant change in appearance of sigmoid diverticulitis and adjacent contained perforation. No evidence of drainable abscess, free air or bowel obstruction.  4.Gout-left first MTP 02/23/2018-prescribedshort course of indomethacin   Disposition: [name] is in remission from rectal cancer.  We will follow up on the CEA from today.  He will return for an office visit and CEA in 6 months.  He will continue colonoscopy surveillance with Dr. Hilarie Fredrickson.  Betsy Coder, MD  04/13/2020  2:47 PM

## 2020-04-17 ENCOUNTER — Other Ambulatory Visit: Payer: Self-pay

## 2020-04-17 MED ORDER — LINZESS 72 MCG PO CAPS: 72.0000 ug | ORAL_CAPSULE | Freq: Every day | ORAL | 2 refills | Status: DC

## 2020-05-04 ENCOUNTER — Other Ambulatory Visit: Payer: Self-pay

## 2020-05-04 MED ORDER — LINZESS 72 MCG PO CAPS: 72.0000 ug | ORAL_CAPSULE | Freq: Every day | ORAL | 2 refills | Status: AC

## 2020-07-14 ENCOUNTER — Telehealth (INDEPENDENT_AMBULATORY_CARE_PROVIDER_SITE_OTHER): Payer: Federal, State, Local not specified - PPO | Admitting: Legal Medicine

## 2020-07-14 VITALS — Wt 220.0 lb

## 2020-07-14 DIAGNOSIS — F5101 Primary insomnia: Secondary | ICD-10-CM

## 2020-07-14 DIAGNOSIS — F5102 Adjustment insomnia: Secondary | ICD-10-CM | POA: Diagnosis not present

## 2020-07-14 MED ORDER — ZOLPIDEM TARTRATE 10 MG PO TABS: 10.0000 mg | ORAL_TABLET | Freq: Every evening | ORAL | 1 refills | Status: DC | PRN

## 2020-07-14 NOTE — Progress Notes (Signed)
Virtual Visit via Telephone Note   This visit type was conducted due to national recommendations for restrictions regarding the COVID-19 Pandemic (e.g. social distancing) in an effort to limit this patient's exposure and mitigate transmission in our community.  Due to his co-morbid illnesses, this patient is at least at moderate risk for complications without adequate follow up.  This format is felt to be most appropriate for this patient at this time.  The patient did not have access to video technology/had technical difficulties with video requiring transitioning to audio format only (telephone).  All issues noted in this document were discussed and addressed.  No physical exam could be performed with this format.  Patient verbally consented to a telehealth visit.   Date:  07/16/2020   ID:  [name], DOB [birth year], MRN [id number]  Patient Location: Home Provider Location: Office/Clinic  PCP:  Lillard Anes, MD   Evaluation Performed:  New Patient Evaluation  Chief Complaint:  Daughter battling cancer  History of Present Illness:    Jeffrey Nolan is a 59 y.o. male with daughter battling cancer, he is not sleeping and his wife has same symptoms.  He requests something for sleep.  The patient does not have symptoms concerning for COVID-19 infection (fever, chills, cough, or new shortness of breath).    Past Medical History:  Diagnosis Date  . Diverticulitis   . Hepatic cyst   . Rectal adenocarcinoma -mid rectum rT3rN0 10/15/2017  . Tubular adenoma of colon     Past Surgical History:  Procedure Laterality Date  . COLONOSCOPY  10/03/2017  . ILEOSTOMY CLOSURE  04/02/2018  . LAPAROSCOPIC LOW ANTERIOR RESECTION     with resulting ileostomy due to rectal cancer    Family History  Problem Relation Age of Onset  . Bladder Cancer Father   . Lung cancer Father   . Cancer - Other Father   . Heart disease Father   . Stroke Mother   . Colon cancer Neg Hx   .  Esophageal cancer Neg Hx   . Pancreatic cancer Neg Hx   . Rectal cancer Neg Hx   . Stomach cancer Neg Hx   . Colon polyps Neg Hx     Social History   Socioeconomic History  . Marital status: Married    Spouse name: Not on file  . Number of children: Not on file  . Years of education: Not on file  . Highest education level: Not on file  Occupational History  . Not on file  Tobacco Use  . Smoking status: Former Research scientist (life sciences)  . Smokeless tobacco: Former Systems developer  . Tobacco comment: quit at age 64; only smoked socially  Vaping Use  . Vaping Use: Every day  . Substances: Nicotine  Substance and Sexual Activity  . Alcohol use: Yes    Comment: Rare Beer  . Drug use: No  . Sexual activity: Not on file  Other Topics Concern  . Not on file  Social History Narrative  . Not on file   Social Determinants of Health   Financial Resource Strain:   . Difficulty of Paying Living Expenses: Not on file  Food Insecurity:   . Worried About Charity fundraiser in the Last Year: Not on file  . Ran Out of Food in the Last Year: Not on file  Transportation Needs:   . Lack of Transportation (Medical): Not on file  . Lack of Transportation (Non-Medical): Not on file  Physical Activity:   . Days  of Exercise per Week: Not on file  . Minutes of Exercise per Session: Not on file  Stress:   . Feeling of Stress : Not on file  Social Connections:   . Frequency of Communication with Friends and Family: Not on file  . Frequency of Social Gatherings with Friends and Family: Not on file  . Attends Religious Services: Not on file  . Active Member of Clubs or Organizations: Not on file  . Attends Archivist Meetings: Not on file  . Marital Status: Not on file  Intimate Partner Violence:   . Fear of Current or Ex-Partner: Not on file  . Emotionally Abused: Not on file  . Physically Abused: Not on file  . Sexually Abused: Not on file    Outpatient Medications Prior to Visit  Medication Sig  Dispense Refill  . aspirin EC 81 MG tablet Take 81 mg by mouth daily.    . Cholecalciferol (VITAMIN D3 PO) Take by mouth.    Marland Kitchen GARLIC PO Take by mouth.    . Ibuprofen-diphenhydrAMINE Cit (ADVIL PM PO) Take 1 tablet by mouth at bedtime.    Marland Kitchen LINZESS 72 MCG capsule Take 1 capsule (72 mcg total) by mouth daily before breakfast. 90 capsule 2  . Melatonin 10 MG TABS Take by mouth at bedtime as needed.    . Multiple Vitamins-Minerals (ZINC PO) Take by mouth.    . Omega-3 Fatty Acids (FISH OIL PO) Take by mouth.     No facility-administered medications prior to visit.   .med Allergies:   Patient has no known allergies.   Social History   Tobacco Use  . Smoking status: Former Research scientist (life sciences)  . Smokeless tobacco: Former Systems developer  . Tobacco comment: quit at age 48; only smoked socially  Vaping Use  . Vaping Use: Every day  . Substances: Nicotine  Substance Use Topics  . Alcohol use: Yes    Comment: Rare Beer  . Drug use: No     ROS   Labs/Other Tests and Data Reviewed:    Recent Labs: No results found for requested labs within last 8760 hours.   Recent Lipid Panel No results found for: CHOL, TRIG, HDL, CHOLHDL, LDLCALC, LDLDIRECT  Wt Readings from Last 3 Encounters:  07/14/20 220 lb (99.8 kg)  04/13/20 220 lb 3.2 oz (99.9 kg)  10/25/19 218 lb 3.2 oz (99 kg)     Objective:    Vital Signs:  Wt 220 lb (99.8 kg)   BMI 28.25 kg/m    Physical Exam VS stable  ASSESSMENT & PLAN:   Diagnoses and all orders for this visit: Primary insomnia -     zolpidem (AMBIEN) 10 MG tablet; Take 1 tablet (10 mg total) by mouth at bedtime as needed for sleep. Insomnia due to stress Patient is under great stress from child having cancer and doing poorly     Meds ordered this encounter  Medications  . zolpidem (AMBIEN) 10 MG tablet    Sig: Take 1 tablet (10 mg total) by mouth at bedtime as needed for sleep.    Dispense:  30 tablet    Refill:  1    COVID-19 Education: The signs and symptoms of  COVID-19 were discussed with the patient and how to seek care for testing (follow up with PCP or arrange E-visit). The importance of social distancing was discussed today.  Time:   Today, I have spent 20 minutes with the patient with telehealth technology discussing the above problems.  Follow Up:  In Person prn  Signed, Reinaldo Meeker, MD  07/16/2020 2:53 PM    [place]

## 2020-07-16 ENCOUNTER — Encounter: Payer: Self-pay | Admitting: Legal Medicine

## 2020-07-16 DIAGNOSIS — F5102 Adjustment insomnia: Secondary | ICD-10-CM | POA: Insufficient documentation

## 2020-08-14 ENCOUNTER — Other Ambulatory Visit: Payer: Self-pay

## 2020-08-14 ENCOUNTER — Encounter: Payer: Self-pay | Admitting: Legal Medicine

## 2020-08-14 ENCOUNTER — Ambulatory Visit (INDEPENDENT_AMBULATORY_CARE_PROVIDER_SITE_OTHER): Payer: Federal, State, Local not specified - PPO | Admitting: Legal Medicine

## 2020-08-14 DIAGNOSIS — F418 Other specified anxiety disorders: Secondary | ICD-10-CM | POA: Diagnosis not present

## 2020-08-14 MED ORDER — ALPRAZOLAM 0.5 MG PO TBDP: 0.5000 mg | ORAL_TABLET | Freq: Two times a day (BID) | ORAL | 2 refills | Status: DC | PRN

## 2020-08-14 NOTE — Progress Notes (Signed)
Subjective:  Patient ID: Jeffrey Nolan, male    DOB: [birth year]  Age: 59 y.o. MRN: [id number]  Chief Complaint  Patient presents with  . Anxiety  . Insomnia    HPI: Chronic visit  Daughter has incurable cancer and on chemotherapy.  He has been upset and sleeping poorly.  He has continued to work.  PHQ9 is 8, moderate on anxiety scale.  We discussed his situation and possible need for counseling.  He agrees.    Current Outpatient Medications on File Prior to Visit  Medication Sig Dispense Refill  . aspirin EC 81 MG tablet Take 81 mg by mouth daily.    . Cholecalciferol (VITAMIN D3 PO) Take by mouth.    Marland Kitchen GARLIC PO Take by mouth.    . Ibuprofen-diphenhydrAMINE Cit (ADVIL PM PO) Take 1 tablet by mouth at bedtime.    Marland Kitchen LINZESS 72 MCG capsule Take 1 capsule (72 mcg total) by mouth daily before breakfast. 90 capsule 2  . Melatonin 10 MG TABS Take by mouth at bedtime as needed.    . Multiple Vitamins-Minerals (ZINC PO) Take by mouth.    . Omega-3 Fatty Acids (FISH OIL PO) Take by mouth.    . zolpidem (AMBIEN) 10 MG tablet Take 1 tablet (10 mg total) by mouth at bedtime as needed for sleep. 30 tablet 1   No current facility-administered medications on file prior to visit.   Past Medical History:  Diagnosis Date  . Diverticulitis   . Hepatic cyst   . Rectal adenocarcinoma -mid rectum rT3rN0 10/15/2017  . Tubular adenoma of colon    Past Surgical History:  Procedure Laterality Date  . COLONOSCOPY  10/03/2017  . ILEOSTOMY CLOSURE  04/02/2018  . LAPAROSCOPIC LOW ANTERIOR RESECTION     with resulting ileostomy due to rectal cancer    Family History  Problem Relation Age of Onset  . Bladder Cancer Father   . Lung cancer Father   . Cancer - Other Father   . Heart disease Father   . Stroke Mother   . Colon cancer Neg Hx   . Esophageal cancer Neg Hx   . Pancreatic cancer Neg Hx   . Rectal cancer Neg Hx   . Stomach cancer Neg Hx   . Colon polyps Neg Hx    Social History    Socioeconomic History  . Marital status: Married    Spouse name: Not on file  . Number of children: Not on file  . Years of education: Not on file  . Highest education level: Not on file  Occupational History  . Not on file  Tobacco Use  . Smoking status: Former Smoker    Types: Cigarettes  . Smokeless tobacco: Former Systems developer    Types: Chew  . Tobacco comment: quit at age 62; only smoked socially  Vaping Use  . Vaping Use: Every day  . Substances: Nicotine  Substance and Sexual Activity  . Alcohol use: Yes    Alcohol/week: 1.0 standard drink    Types: 1 Cans of beer per week    Comment: Rare Beer  . Drug use: No  . Sexual activity: Not Currently    Partners: Female  Other Topics Concern  . Not on file  Social History Narrative  . Not on file   Social Determinants of Health   Financial Resource Strain:   . Difficulty of Paying Living Expenses: Not on file  Food Insecurity:   . Worried About Charity fundraiser in the  Last Year: Not on file  . Ran Out of Food in the Last Year: Not on file  Transportation Needs:   . Lack of Transportation (Medical): Not on file  . Lack of Transportation (Non-Medical): Not on file  Physical Activity:   . Days of Exercise per Week: Not on file  . Minutes of Exercise per Session: Not on file  Stress:   . Feeling of Stress : Not on file  Social Connections:   . Frequency of Communication with Friends and Family: Not on file  . Frequency of Social Gatherings with Friends and Family: Not on file  . Attends Religious Services: Not on file  . Active Member of Clubs or Organizations: Not on file  . Attends Archivist Meetings: Not on file  . Marital Status: Not on file    Review of Systems  Constitutional: Negative.   HENT: Negative.   Eyes: Negative.   Respiratory: Negative for cough, choking and shortness of breath.   Cardiovascular: Negative for chest pain, palpitations and leg swelling.  Gastrointestinal: Negative.    Endocrine: Negative.   Genitourinary: Negative.   Musculoskeletal: Negative.   Skin: Negative.   Neurological: Negative.      Objective:  BP (!) 150/100   Pulse 74   Temp 97.7 F (36.5 C)   Resp 16   Ht 6\' 2"  (1.88 m)   Wt 224 lb (101.6 kg)   BMI 28.76 kg/m   BP/Weight 08/14/2020 07/14/2020 1/61/0960  Systolic BP 454 - 098  Diastolic BP 119 - 88  Wt. (Lbs) 224 220 220.2  BMI 28.76 28.25 28.27    Physical Exam Vitals reviewed.  Constitutional:      Appearance: Normal appearance.  HENT:     Head: Normocephalic and atraumatic.     Right Ear: Tympanic membrane, ear canal and external ear normal.     Left Ear: Tympanic membrane and ear canal normal.     Mouth/Throat:     Mouth: Mucous membranes are moist.     Pharynx: Oropharynx is clear.  Eyes:     Extraocular Movements: Extraocular movements intact.     Conjunctiva/sclera: Conjunctivae normal.     Pupils: Pupils are equal, round, and reactive to light.  Cardiovascular:     Rate and Rhythm: Normal rate and regular rhythm.     Pulses: Normal pulses.     Heart sounds: Normal heart sounds.  Pulmonary:     Effort: Pulmonary effort is normal.     Breath sounds: Normal breath sounds.  Abdominal:     General: Abdomen is flat.     Palpations: Abdomen is soft.  Musculoskeletal:        General: Normal range of motion.     Cervical back: Normal range of motion and neck supple.  Skin:    General: Skin is warm and dry.     Capillary Refill: Capillary refill takes less than 2 seconds.  Neurological:     General: No focal deficit present.     Mental Status: He is alert and oriented to person, place, and time.    Depression screen PHQ 2/9 08/14/2020  Decreased Interest 2  Down, Depressed, Hopeless 1  PHQ - 2 Score 3  Altered sleeping 3  Tired, decreased energy 2  Change in appetite 0  Feeling bad or failure about yourself  0  Trouble concentrating 0  Moving slowly or fidgety/restless 0  Suicidal thoughts 0  PHQ-9  Score 8  Difficult doing work/chores Somewhat difficult  Lab Results  Component Value Date   WBC 4.1 03/18/2018   HGB 13.7 03/18/2018   HCT 40.7 03/18/2018   PLT 229 03/18/2018   GLUCOSE 104 03/18/2018   ALT 21 03/18/2018   AST 17 03/18/2018   NA 135 (L) 03/18/2018   K 4.4 03/18/2018   CL 99 03/18/2018   CREATININE 1.18 03/18/2018   BUN 14 03/18/2018   CO2 23 03/18/2018      Assessment & Plan:   1. Situational anxiety Patient is under stress with wife having MS and daughter with incurable cancer.  We discussed use of alprazolam as needed but warned about sedation.        Follow-up: Return in about 3 months (around 11/14/2020).  An After Visit Summary was printed and given to the patient.  [place] [phone]

## 2020-08-21 ENCOUNTER — Ambulatory Visit: Payer: Federal, State, Local not specified - PPO | Admitting: Legal Medicine

## 2020-08-21 ENCOUNTER — Encounter: Payer: Self-pay | Admitting: Legal Medicine

## 2020-08-21 ENCOUNTER — Other Ambulatory Visit: Payer: Self-pay

## 2020-08-21 VITALS — BP 155/88 | HR 83 | Temp 98.7°F | Resp 16 | Ht 74.0 in | Wt 228.4 lb

## 2020-08-21 DIAGNOSIS — Z13220 Encounter for screening for lipoid disorders: Secondary | ICD-10-CM

## 2020-08-21 DIAGNOSIS — K589 Irritable bowel syndrome without diarrhea: Secondary | ICD-10-CM | POA: Diagnosis not present

## 2020-08-21 MED ORDER — CILIDINIUM-CHLORDIAZEPOXIDE 2.5-5 MG PO CAPS: 1.0000 | ORAL_CAPSULE | Freq: Three times a day (TID) | ORAL | 3 refills | Status: AC

## 2020-08-21 NOTE — Progress Notes (Signed)
Established Patient Office Visit  Subjective:  Patient ID: Jeffrey Nolan, male    DOB: September 26, 1961  Age: 59 y.o. MRN: [id number]  CC:  Chief Complaint  Patient presents with  . Acute Visit    pt is fasting,pt has IBS flare up due to stress an anxiety, pt has forms to fill out    HPI Jeffrey Nolan presents for exacerbation of IBS sin colon surgery from colon cancer.  He has some narrowing in colon.  He is using linzess that is helping but he had diarrhea at night and gets poor sleep/  He is feeling anxous inside due to to daughter's cancer  Past Medical History:  Diagnosis Date  . Rectal adenocarcinoma -mid rectum rT3rN0 10/15/2017    Past Surgical History:  Procedure Laterality Date  . COLONOSCOPY  10/03/2017  . ILEOSTOMY CLOSURE  04/02/2018  . LAPAROSCOPIC LOW ANTERIOR RESECTION     with resulting ileostomy due to rectal cancer    Family History  Problem Relation Age of Onset  . Bladder Cancer Father   . Lung cancer Father   . Cancer - Other Father   . Heart disease Father   . Stroke Mother   . Colon cancer Neg Hx   . Esophageal cancer Neg Hx   . Pancreatic cancer Neg Hx   . Rectal cancer Neg Hx   . Stomach cancer Neg Hx   . Colon polyps Neg Hx     Social History   Socioeconomic History  . Marital status: Married    Spouse name: Not on file  . Number of children: Not on file  . Years of education: Not on file  . Highest education level: Not on file  Occupational History  . Not on file  Tobacco Use  . Smoking status: Former Smoker    Types: Cigarettes  . Smokeless tobacco: Former Systems developer    Types: Chew  . Tobacco comment: quit at age 35; only smoked socially  Vaping Use  . Vaping Use: Every day  . Substances: Nicotine  Substance and Sexual Activity  . Alcohol use: Yes    Alcohol/week: 1.0 standard drink    Types: 1 Cans of beer per week    Comment: Rare Beer  . Drug use: No  . Sexual activity: Not Currently    Partners: Female  Other Topics  Concern  . Not on file  Social History Narrative  . Not on file   Social Determinants of Health   Financial Resource Strain:   . Difficulty of Paying Living Expenses: Not on file  Food Insecurity:   . Worried About Charity fundraiser in the Last Year: Not on file  . Ran Out of Food in the Last Year: Not on file  Transportation Needs:   . Lack of Transportation (Medical): Not on file  . Lack of Transportation (Non-Medical): Not on file  Physical Activity:   . Days of Exercise per Week: Not on file  . Minutes of Exercise per Session: Not on file  Stress:   . Feeling of Stress : Not on file  Social Connections:   . Frequency of Communication with Friends and Family: Not on file  . Frequency of Social Gatherings with Friends and Family: Not on file  . Attends Religious Services: Not on file  . Active Member of Clubs or Organizations: Not on file  . Attends Archivist Meetings: Not on file  . Marital Status: Not on file  Intimate Partner Violence:   .  Fear of Current or Ex-Partner: Not on file  . Emotionally Abused: Not on file  . Physically Abused: Not on file  . Sexually Abused: Not on file    Outpatient Medications Prior to Visit  Medication Sig Dispense Refill  . ALPRAZolam (NIRAVAM) 0.5 MG dissolvable tablet Take 1 tablet (0.5 mg total) by mouth 2 (two) times daily as needed for anxiety. 60 tablet 2  . aspirin EC 81 MG tablet Take 81 mg by mouth daily.    . Cholecalciferol (VITAMIN D3 PO) Take by mouth.    Marland Kitchen GARLIC PO Take by mouth.    . Ibuprofen-diphenhydrAMINE Cit (ADVIL PM PO) Take 1 tablet by mouth at bedtime.    Marland Kitchen LINZESS 72 MCG capsule Take 1 capsule (72 mcg total) by mouth daily before breakfast. 90 capsule 2  . Melatonin 10 MG TABS Take by mouth at bedtime as needed.    . Multiple Vitamins-Minerals (ZINC PO) Take by mouth.    . Omega-3 Fatty Acids (FISH OIL PO) Take by mouth.    . zolpidem (AMBIEN) 10 MG tablet Take 1 tablet (10 mg total) by mouth at  bedtime as needed for sleep. 30 tablet 1   No facility-administered medications prior to visit.    No Known Allergies  ROS Review of Systems    Objective:    Physical Exam  BP (!) 155/88   Pulse 83   Temp 98.7 F (37.1 C)   Resp 16   Ht 6\' 2"  (1.88 m)   Wt 228 lb 6.4 oz (103.6 kg)   SpO2 95%   BMI 29.32 kg/m  Wt Readings from Last 3 Encounters:  08/21/20 228 lb 6.4 oz (103.6 kg)  08/14/20 224 lb (101.6 kg)  07/14/20 220 lb (99.8 kg)     Health Maintenance Due  Topic Date Due  . Hepatitis C Screening  Never done  . HIV Screening  Never done  . TETANUS/TDAP  Never done  . INFLUENZA VACCINE  05/21/2020    There are no preventive care reminders to display for this patient.  No results found for: TSH Lab Results  Component Value Date   WBC 4.1 03/18/2018   HGB 13.7 03/18/2018   HCT 40.7 03/18/2018   MCV 87.9 03/18/2018   PLT 229 03/18/2018   Lab Results  Component Value Date   NA 135 (L) 03/18/2018   K 4.4 03/18/2018   CO2 23 03/18/2018   GLUCOSE 104 03/18/2018   BUN 14 03/18/2018   CREATININE 1.18 03/18/2018   BILITOT 0.3 03/18/2018   ALKPHOS 67 03/18/2018   AST 17 03/18/2018   ALT 21 03/18/2018   PROT 7.4 03/18/2018   ALBUMIN 4.2 03/18/2018   CALCIUM 9.7 03/18/2018   ANIONGAP 13 (H) 03/18/2018   GFR 72.62 10/03/2017   No results found for: CHOL No results found for: HDL No results found for: LDLCALC No results found for: TRIG No results found for: CHOLHDL No results found for: HGBA1C    Assessment & Plan:   Problem List Items Addressed This Visit    None    Visit Diagnoses    Irritable bowel syndrome without diarrhea    -  Primary   Relevant Medications   clidinium-chlordiazePOXIDE (LIBRAX) 5-2.5 MG capsule Patient is having severe IBS symptoms.  He is under stress with daughter having cancer and wife with MS.  I filled out LOA.  Note given to work to return in one week.   Screening for cholesterol level  Relevant Orders    Lipid panel Cholesterol level drawn      Meds ordered this encounter  Medications  . clidinium-chlordiazePOXIDE (LIBRAX) 5-2.5 MG capsule    Sig: Take 1 capsule by mouth 3 (three) times daily before meals.    Dispense:  90 capsule    Refill:  3    Follow-up: Return in about 2 weeks (around 09/04/2020).    Reinaldo Meeker, MD

## 2020-08-22 ENCOUNTER — Telehealth: Payer: Self-pay

## 2020-08-22 ENCOUNTER — Other Ambulatory Visit: Payer: Self-pay | Admitting: Legal Medicine

## 2020-08-22 DIAGNOSIS — E782 Mixed hyperlipidemia: Secondary | ICD-10-CM

## 2020-08-22 LAB — LIPID PANEL
Chol/HDL Ratio: 5.3 ratio — ABNORMAL HIGH (ref 0.0–5.0)
Cholesterol, Total: 310 mg/dL — ABNORMAL HIGH (ref 100–199)
HDL: 59 mg/dL (ref >39)
LDL Chol Calc (NIH): 207 mg/dL — ABNORMAL HIGH (ref 0–99)
Triglycerides: 227 mg/dL — ABNORMAL HIGH (ref 0–149)
VLDL Cholesterol Cal: 44 mg/dL — ABNORMAL HIGH (ref 5–40)

## 2020-08-22 LAB — CARDIOVASCULAR RISK ASSESSMENT

## 2020-08-22 MED ORDER — ATORVASTATIN CALCIUM 40 MG PO TABS: 40.0000 mg | ORAL_TABLET | Freq: Every day | ORAL | 3 refills | Status: AC

## 2020-08-22 NOTE — Telephone Encounter (Signed)
I called in atorvastatin lp

## 2020-08-22 NOTE — Telephone Encounter (Signed)
Dr. Henrene Pastor pt called asking if he should be on cholesterol medication. He seen his results on My Chart and left a message asking if he should be on medication that he was ok if he needs to be on any cholesterol medications.

## 2020-08-22 NOTE — Progress Notes (Signed)
Triglycerides high 227, LDL 207 high strongly statin to lower cardiovascular risks recommend  lp

## 2020-08-23 NOTE — Telephone Encounter (Signed)
Pt was made aware of medication sent.

## 2020-10-01 DIAGNOSIS — M10072 Idiopathic gout, left ankle and foot: Secondary | ICD-10-CM | POA: Diagnosis not present

## 2020-10-02 ENCOUNTER — Telehealth: Payer: Self-pay

## 2020-10-02 NOTE — Telephone Encounter (Signed)
Prior authorization request has been approved for linzess. The authorization is valid from 08/28/2020 through 09/27/2021.

## 2020-10-03 ENCOUNTER — Inpatient Hospital Stay: Payer: Federal, State, Local not specified - PPO

## 2020-10-03 ENCOUNTER — Inpatient Hospital Stay: Payer: Federal, State, Local not specified - PPO | Attending: Oncology | Admitting: Oncology

## 2020-10-05 ENCOUNTER — Telehealth: Payer: Self-pay | Admitting: *Deleted

## 2020-10-05 ENCOUNTER — Telehealth: Payer: Federal, State, Local not specified - PPO | Admitting: Legal Medicine

## 2020-10-05 NOTE — Telephone Encounter (Signed)
Left VM requesting return call to reschedule his missed appointment. Scheduling message sent.

## 2020-10-06 ENCOUNTER — Telehealth: Payer: Self-pay | Admitting: Oncology

## 2020-10-06 DIAGNOSIS — Z20822 Contact with and (suspected) exposure to covid-19: Secondary | ICD-10-CM | POA: Diagnosis not present

## 2020-10-06 NOTE — Telephone Encounter (Signed)
Called pt per 12/16 sch msg - no answer. Left message for patient to call back to reschedule missed appt.

## 2020-10-08 DIAGNOSIS — Z20828 Contact with and (suspected) exposure to other viral communicable diseases: Secondary | ICD-10-CM | POA: Diagnosis not present

## 2020-10-08 DIAGNOSIS — U071 COVID-19: Secondary | ICD-10-CM | POA: Diagnosis not present

## 2020-10-25 ENCOUNTER — Encounter: Payer: Self-pay | Admitting: Legal Medicine

## 2020-10-25 ENCOUNTER — Telehealth (INDEPENDENT_AMBULATORY_CARE_PROVIDER_SITE_OTHER): Payer: Federal, State, Local not specified - PPO | Admitting: Legal Medicine

## 2020-10-25 DIAGNOSIS — M109 Gout, unspecified: Secondary | ICD-10-CM | POA: Insufficient documentation

## 2020-10-25 DIAGNOSIS — M10079 Idiopathic gout, unspecified ankle and foot: Secondary | ICD-10-CM | POA: Diagnosis not present

## 2020-10-25 MED ORDER — INDOMETHACIN 50 MG PO CAPS: 50.0000 mg | ORAL_CAPSULE | Freq: Three times a day (TID) | ORAL | 0 refills | Status: DC

## 2020-10-25 NOTE — Progress Notes (Signed)
Virtual Visit via Telephone Note   This visit type was conducted due to national recommendations for restrictions regarding the COVID-19 Pandemic (e.g. social distancing) in an effort to limit this patient's exposure and mitigate transmission in our community.  Due to his co-morbid illnesses, this patient is at least at moderate risk for complications without adequate follow up.  This format is felt to be most appropriate for this patient at this time.  The patient did not have access to video technology/had technical difficulties with video requiring transitioning to audio format only (telephone).  All issues noted in this document were discussed and addressed.  No physical exam could be performed with this format.  Patient verbally consented to a telehealth visit.   Date:  10/25/2020   ID:  Tawnya Crook, DOB 1961/02/02, MRN [id number]  Patient Location: Home Provider Location: Office/Clinic  PCP:  Abigail Miyamoto, MD   Evaluation Performed:  New Patient Evaluation  Chief Complaint:  gout  History of Present Illness:    Jeffrey Nolan is a 60 y.o. male with gout last month in urgent care, 2 days ago . Joint heel pain and redness  The patient does not have symptoms concerning for COVID-19 infection (fever, chills, cough, or new shortness of breath).    Past Medical History:  Diagnosis Date  . Rectal adenocarcinoma -mid rectum [id number] 10/15/2017    Past Surgical History:  Procedure Laterality Date  . COLONOSCOPY  10/03/2017  . ILEOSTOMY CLOSURE  04/02/2018  . LAPAROSCOPIC LOW ANTERIOR RESECTION     with resulting ileostomy due to rectal cancer    Family History  Problem Relation Age of Onset  . Bladder Cancer Father   . Lung cancer Father   . Cancer - Other Father   . Heart disease Father   . Stroke Mother   . Colon cancer Neg Hx   . Esophageal cancer Neg Hx   . Pancreatic cancer Neg Hx   . Rectal cancer Neg Hx   . Stomach cancer Neg Hx   . Colon polyps Neg Hx      Social History   Socioeconomic History  . Marital status: Married    Spouse name: Not on file  . Number of children: Not on file  . Years of education: Not on file  . Highest education level: Not on file  Occupational History  . Not on file  Tobacco Use  . Smoking status: Former Smoker    Types: Cigarettes  . Smokeless tobacco: Former Neurosurgeon    Types: Chew  . Tobacco comment: quit at age 16; only smoked socially  Vaping Use  . Vaping Use: Every day  . Substances: Nicotine  Substance and Sexual Activity  . Alcohol use: Yes    Alcohol/week: 1.0 standard drink    Types: 1 Cans of beer per week    Comment: Rare Beer  . Drug use: No  . Sexual activity: Not Currently    Partners: Female  Other Topics Concern  . Not on file  Social History Narrative  . Not on file   Social Determinants of Health   Financial Resource Strain: Not on file  Food Insecurity: Not on file  Transportation Needs: Not on file  Physical Activity: Not on file  Stress: Not on file  Social Connections: Not on file  Intimate Partner Violence: Not on file    Outpatient Medications Prior to Visit  Medication Sig Dispense Refill  . ALPRAZolam (NIRAVAM) 0.5 MG dissolvable tablet Take 1 tablet (  0.5 mg total) by mouth 2 (two) times daily as needed for anxiety. 60 tablet 2  . aspirin EC 81 MG tablet Take 81 mg by mouth daily.    Marland Kitchen atorvastatin (LIPITOR) 40 MG tablet Take 1 tablet (40 mg total) by mouth daily. 90 tablet 3  . Cholecalciferol (VITAMIN D3 PO) Take by mouth.    . clidinium-chlordiazePOXIDE (LIBRAX) 5-2.5 MG capsule Take 1 capsule by mouth 3 (three) times daily before meals. 90 capsule 3  . GARLIC PO Take by mouth.    . Ibuprofen-diphenhydrAMINE Cit (ADVIL PM PO) Take 1 tablet by mouth at bedtime.    Marland Kitchen LINZESS 72 MCG capsule Take 1 capsule (72 mcg total) by mouth daily before breakfast. 90 capsule 2  . Melatonin 10 MG TABS Take by mouth at bedtime as needed.    . Multiple Vitamins-Minerals  (ZINC PO) Take by mouth.    . Omega-3 Fatty Acids (FISH OIL PO) Take by mouth.    . zolpidem (AMBIEN) 10 MG tablet Take 1 tablet (10 mg total) by mouth at bedtime as needed for sleep. 30 tablet 1  . colchicine 0.6 MG tablet Take by mouth.    . naproxen (NAPROSYN) 500 MG tablet Take 500 mg by mouth 2 (two) times daily.    . predniSONE (DELTASONE) 20 MG tablet Take 20 mg by mouth daily.     No facility-administered medications prior to visit.    Allergies:   Patient has no known allergies.   Social History   Tobacco Use  . Smoking status: Former Smoker    Types: Cigarettes  . Smokeless tobacco: Former Systems developer    Types: Chew  . Tobacco comment: quit at age 22; only smoked socially  Vaping Use  . Vaping Use: Every day  . Substances: Nicotine  Substance Use Topics  . Alcohol use: Yes    Alcohol/week: 1.0 standard drink    Types: 1 Cans of beer per week    Comment: Rare Beer  . Drug use: No     Review of Systems  Constitutional: Negative for fever and malaise/fatigue.  HENT: Negative for ear pain, hearing loss and sore throat.   Eyes: Negative for redness.  Cardiovascular: Negative for chest pain and palpitations.  Gastrointestinal: Negative for melena.  Genitourinary: Negative for dysuria and urgency.  Musculoskeletal: Positive for joint pain. Negative for myalgias and neck pain.  Neurological: Negative for dizziness, weakness and headaches.  Psychiatric/Behavioral: Negative for depression and substance abuse.     Labs/Other Tests and Data Reviewed:    Recent Labs: No results found for requested labs within last 8760 hours.   Recent Lipid Panel Lab Results  Component Value Date/Time   CHOL 310 (H) 08/21/2020 12:18 PM   TRIG 227 (H) 08/21/2020 12:18 PM   HDL 59 08/21/2020 12:18 PM   CHOLHDL 5.3 (H) 08/21/2020 12:18 PM   LDLCALC 207 (H) 08/21/2020 12:18 PM    Wt Readings from Last 3 Encounters:  10/25/20 224 lb (101.6 kg)  08/21/20 228 lb 6.4 oz (103.6 kg)  08/14/20  224 lb (101.6 kg)     Objective:    Vital Signs:  Ht 6\' 2"  (1.88 m)   Wt 224 lb (101.6 kg)   BMI 28.76 kg/m    Physical Exam vs stable  ASSESSMENT & PLAN:   Diagnoses and all orders for this visit: Acute idiopathic gout of foot, unspecified laterality -     indomethacin (INDOCIN) 50 MG capsule; Take 1 capsule (50 mg total) by  mouth 3 (three) times daily with meals. Acute gout , start on indomethacin       COVID-19 Education: The signs and symptoms of COVID-19 were discussed with the patient and how to seek care for testing (follow up with PCP or arrange E-visit). The importance of social distancing was discussed today.   I spent 15 minutes dedicated to the care of this patient on the date of this encounter to include face-to-face time with the patient, as well as:   Follow Up:  In Person prn  Signed,  Reinaldo Meeker, MD  10/25/2020 2:02 PM    [place]

## 2020-10-27 ENCOUNTER — Inpatient Hospital Stay: Payer: Federal, State, Local not specified - PPO | Attending: Oncology

## 2020-10-27 ENCOUNTER — Telehealth: Payer: Self-pay

## 2020-10-27 ENCOUNTER — Other Ambulatory Visit: Payer: Self-pay

## 2020-10-27 ENCOUNTER — Other Ambulatory Visit: Payer: Self-pay | Admitting: *Deleted

## 2020-10-27 ENCOUNTER — Inpatient Hospital Stay (HOSPITAL_BASED_OUTPATIENT_CLINIC_OR_DEPARTMENT_OTHER): Payer: Federal, State, Local not specified - PPO | Admitting: Oncology

## 2020-10-27 VITALS — BP 147/80 | HR 69 | Temp 97.9°F | Resp 18 | Ht 74.0 in | Wt 226.6 lb

## 2020-10-27 DIAGNOSIS — C20 Malignant neoplasm of rectum: Secondary | ICD-10-CM

## 2020-10-27 DIAGNOSIS — M109 Gout, unspecified: Secondary | ICD-10-CM | POA: Insufficient documentation

## 2020-10-27 DIAGNOSIS — Z8616 Personal history of COVID-19: Secondary | ICD-10-CM | POA: Diagnosis not present

## 2020-10-27 DIAGNOSIS — Z85048 Personal history of other malignant neoplasm of rectum, rectosigmoid junction, and anus: Secondary | ICD-10-CM | POA: Insufficient documentation

## 2020-10-27 DIAGNOSIS — F5101 Primary insomnia: Secondary | ICD-10-CM

## 2020-10-27 LAB — CEA (IN HOUSE-CHCC): CEA (CHCC-In House): 1.33 ng/mL (ref 0.00–5.00)

## 2020-10-27 MED ORDER — METHYLPREDNISOLONE 4 MG PO TBPK: ORAL_TABLET | ORAL | 0 refills | Status: AC

## 2020-10-27 NOTE — Progress Notes (Signed)
Per Dr. Benay Spice: Medrol dosepack sent to pharmacy for gout. Will d/c Indocin

## 2020-10-27 NOTE — Telephone Encounter (Signed)
Patient contacted per Dr. Benay Spice to inform CEA is normal. Patient to follow-up as scheduled.

## 2020-10-27 NOTE — Progress Notes (Signed)
[facility] OFFICE PROGRESS NOTE   Diagnosis: Rectal cancer  INTERVAL HISTORY:   Jeffrey Nolan returns for a scheduled visit.  He feels well.  Good appetite and energy level.  IBS symptoms improved when he began Librax.  He was diagnosed with COVID-19 infection last month.  He and his wife tested Covid positive.  He received antibody therapy and improved in a few days.  He currently has a flare of gout at the right heel/Achilles.  He started indomethacin yesterday and this has not helped.  He reports prednisone has helped in the past.  Objective:  Vital signs in last 24 hours:  Blood pressure (!) 147/80, pulse 69, temperature 97.9 F (36.6 C), temperature source Tympanic, resp. rate 18, height 6\' 2"  (1.88 m), weight 226 lb 9.6 oz (102.8 kg), SpO2 100 %.    Lymphatics: No cervical, supraclavicular, axillary, or inguinal nodes Resp: Lungs clear bilaterally Cardio: Regular rate and rhythm GI: No hepatosplenomegaly, no mass, nontender Vascular: No leg edema Musculoskeletal: Erythema and tenderness at the right calcaneus, no pain with motion at the right ankle      Lab Results:  Lab Results  Component Value Date   WBC 4.1 03/18/2018   HGB 13.7 03/18/2018   HCT 40.7 03/18/2018   MCV 87.9 03/18/2018   PLT 229 03/18/2018   NEUTROABS 3.2 03/18/2018    CMP  Lab Results  Component Value Date   NA 135 (L) 03/18/2018   K 4.4 03/18/2018   CL 99 03/18/2018   CO2 23 03/18/2018   GLUCOSE 104 03/18/2018   BUN 14 03/18/2018   CREATININE 1.18 03/18/2018   CALCIUM 9.7 03/18/2018   PROT 7.4 03/18/2018   ALBUMIN 4.2 03/18/2018   AST 17 03/18/2018   ALT 21 03/18/2018   ALKPHOS 67 03/18/2018   BILITOT 0.3 03/18/2018   GFRNONAA >60 03/18/2018   GFRAA >60 03/18/2018    Lab Results  Component Value Date   CEA1 1.47 04/13/2020    Medications: I have reviewed the patient's current medications.   Assessment/Plan: 1. Rectal cancer-invasive adenocarcinoma  confirmed on biopsy of a mid rectal mass 10/03/2017 ? Staging CTs from 10/08/2017, indeterminate liver lesions, no rectal mass identified. No lymphadenopathy. ? MRI abdomen/pelvis 10/20/2017-no suspicious liver lesions, T3 N0 M0 staging with tumor measured at 9 cm from the anal sphincter ? Initiation of Xeloda/radiation 10/27/2017, held beginning 11/05/2017 secondary to diverticulitis;resumed 11/10/2017, completed 12/08/2017 ? Laparoscopic low anterior resection 02/09/2018-diverticulitis/fibrosis, no malignancy identified, 12negative lymph nodes ? Cycle 1 adjuvant Xeloda 03/02/2018 ? Ileostomy closure 04/02/2018 ? Negative colonoscopy 12/24/2018 2.Distal sigmoid polyp on colonoscopy 10/03/2017-invasive adenocarcinoma involving a tubular adenoma, invasive carcinoma measured 1.1 cm with no lymphovascular space involvement and negative resection margin  Flexible sigmoidoscopy 10/08/2017-no residual polyp identified, area of previous polyp at 22 cm tattooed  3.Admission 11/04/2017 with perforated diverticulitis. Follow-up CT 11/08/2017 with no significant change in appearance of sigmoid diverticulitis and adjacent contained perforation. No evidence of drainable abscess, free air or bowel obstruction.  4.Gout-left first MTP 02/23/2018-prescribedshort course of indomethacin, gout flare at the right heel 10/27/2020-steroid Dosepak     Disposition: Jeffrey Nolan is in clinical remission from rectal cancer.  We will follow up on the CEA from today.  He is now greater than 3 years out from diagnosis.  He plans to relocate to the [place] area.  He will return for an office visit and CEA in approximately 9 months.  He will be due for a surveillance colonoscopy in 2023.  We prescribed a short course of prednisone for the apparent gout flare.  He will discontinue indomethacin.  He will follow-up with his primary provider if the gout does not improve.    Betsy Coder, MD  10/27/2020  8:26  AM

## 2020-10-30 ENCOUNTER — Telehealth: Payer: Self-pay | Admitting: Oncology

## 2020-10-30 NOTE — Telephone Encounter (Signed)
Scheduled appointments per 1/7 los. Spoke to patient who is aware of appointments date and times.  

## 2020-11-01 ENCOUNTER — Other Ambulatory Visit: Payer: Self-pay | Admitting: Legal Medicine

## 2020-11-01 DIAGNOSIS — M10079 Idiopathic gout, unspecified ankle and foot: Secondary | ICD-10-CM

## 2020-12-04 ENCOUNTER — Other Ambulatory Visit: Payer: Self-pay

## 2020-12-04 MED ORDER — ALPRAZOLAM 0.5 MG PO TBDP: 0.5000 mg | ORAL_TABLET | Freq: Two times a day (BID) | ORAL | 3 refills | Status: AC | PRN

## 2020-12-20 ENCOUNTER — Telehealth: Payer: Self-pay

## 2020-12-20 ENCOUNTER — Encounter: Payer: Self-pay | Admitting: Legal Medicine

## 2020-12-20 DIAGNOSIS — I499 Cardiac arrhythmia, unspecified: Secondary | ICD-10-CM | POA: Diagnosis not present

## 2020-12-20 DIAGNOSIS — H547 Unspecified visual loss: Secondary | ICD-10-CM | POA: Diagnosis not present

## 2020-12-21 NOTE — Telephone Encounter (Signed)
Whitney faxed the doctor's note to his work on 01/02/2021. Patient was aware.

## 2021-01-19 DIAGNOSIS — 419620001 Death: Secondary | SNOMED CT | POA: Diagnosis not present

## 2021-01-19 NOTE — Telephone Encounter (Signed)
Patient called and states his gout in his right foot started to flared up and he started taking the medication prescribed for it this morning. Patient is requesting if he can have a doctors note for work for three days so he can stay off his foot. He states he is willing to do a televisit if he needs to.

## 2021-01-19 NOTE — Telephone Encounter (Signed)
Give him doctors note lp

## 2021-01-19 DEATH — deceased

## 2021-08-27 ENCOUNTER — Ambulatory Visit: Payer: Federal, State, Local not specified - PPO | Admitting: Nurse Practitioner

## 2021-08-27 ENCOUNTER — Encounter: Payer: Self-pay | Admitting: *Deleted

## 2021-08-27 ENCOUNTER — Other Ambulatory Visit: Payer: Federal, State, Local not specified - PPO

## 2021-08-27 ENCOUNTER — Inpatient Hospital Stay: Payer: Federal, State, Local not specified - PPO

## 2021-08-27 ENCOUNTER — Inpatient Hospital Stay: Payer: Federal, State, Local not specified - PPO | Admitting: Nurse Practitioner

## 2021-08-27 NOTE — Progress Notes (Signed)
"  No show" for lab/OV today. Scheduling message to reschedule for next month

## 2021-09-27 ENCOUNTER — Telehealth: Payer: Self-pay | Admitting: *Deleted

## 2021-09-27 ENCOUNTER — Inpatient Hospital Stay: Payer: Federal, State, Local not specified - PPO | Admitting: Nurse Practitioner

## 2021-09-27 ENCOUNTER — Inpatient Hospital Stay: Payer: Federal, State, Local not specified - PPO | Attending: Legal Medicine

## 2021-09-27 NOTE — Telephone Encounter (Signed)
"  No show" for his appointment today. Called Mrs. [place] after no answer from Mr. Bean. She reports he committed suicide 12/21/20. Provided her our sincere condolences.
